# Patient Record
Sex: Female | Born: 1960 | Race: White | Hispanic: No | Marital: Married | Smoking: Current every day smoker
Health system: Southern US, Community
[De-identification: ages and names within clinical notes are randomized; demographics above are authoritative.]

## PROBLEM LIST (undated history)

## (undated) DIAGNOSIS — R112 Nausea with vomiting, unspecified: Secondary | ICD-10-CM

## (undated) DIAGNOSIS — T148XXA Other injury of unspecified body region, initial encounter: Secondary | ICD-10-CM

## (undated) DIAGNOSIS — Z8709 Personal history of other diseases of the respiratory system: Secondary | ICD-10-CM

## (undated) DIAGNOSIS — M1990 Unspecified osteoarthritis, unspecified site: Secondary | ICD-10-CM

## (undated) DIAGNOSIS — Z9109 Other allergy status, other than to drugs and biological substances: Secondary | ICD-10-CM

## (undated) DIAGNOSIS — Z9889 Other specified postprocedural states: Secondary | ICD-10-CM

## (undated) HISTORY — PX: FOOT SURGERY: SHX648

## (undated) HISTORY — PX: BACK SURGERY: SHX140

## (undated) HISTORY — PX: NECK SURGERY: SHX720

## (undated) HISTORY — PX: OTHER SURGICAL HISTORY: SHX169

## (undated) HISTORY — PX: ABDOMINAL HYSTERECTOMY: SHX81

---

## 2008-08-06 ENCOUNTER — Emergency Department (HOSPITAL_BASED_OUTPATIENT_CLINIC_OR_DEPARTMENT_OTHER): Admission: EM | Admit: 2008-08-06 | Discharge: 2008-08-06 | Payer: Self-pay | Admitting: Emergency Medicine
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
## 2015-03-28 ENCOUNTER — Emergency Department (HOSPITAL_BASED_OUTPATIENT_CLINIC_OR_DEPARTMENT_OTHER)
Admission: EM | Admit: 2015-03-28 | Discharge: 2015-03-28 | Disposition: A | Discharge status: Home / Self Care | Payer: Medicare Other | Attending: Emergency Medicine | Admitting: Emergency Medicine

## 2015-03-28 ENCOUNTER — Encounter (HOSPITAL_BASED_OUTPATIENT_CLINIC_OR_DEPARTMENT_OTHER): Payer: Self-pay

## 2015-03-28 DIAGNOSIS — R2243 Localized swelling, mass and lump, lower limb, bilateral: Secondary | ICD-10-CM | POA: Diagnosis present

## 2015-03-28 DIAGNOSIS — M7989 Other specified soft tissue disorders: Secondary | ICD-10-CM

## 2015-03-28 DIAGNOSIS — Z79899 Other long term (current) drug therapy: Secondary | ICD-10-CM | POA: Diagnosis not present

## 2015-03-28 DIAGNOSIS — R2233 Localized swelling, mass and lump, upper limb, bilateral: Secondary | ICD-10-CM | POA: Diagnosis not present

## 2015-03-28 DIAGNOSIS — M1990 Unspecified osteoarthritis, unspecified site: Secondary | ICD-10-CM | POA: Insufficient documentation

## 2015-03-28 DIAGNOSIS — Z720 Tobacco use: Secondary | ICD-10-CM | POA: Insufficient documentation

## 2015-03-28 DIAGNOSIS — G8929 Other chronic pain: Secondary | ICD-10-CM | POA: Insufficient documentation

## 2015-03-28 HISTORY — DX: Other injury of unspecified body region, initial encounter: T14.8XXA

## 2015-03-28 HISTORY — DX: Unspecified osteoarthritis, unspecified site: M19.90

## 2015-03-28 LAB — CBC WITH DIFFERENTIAL/PLATELET
BASOS ABS: 0 10*3/uL (ref 0.0–0.1)
BASOS PCT: 1 % (ref 0–1)
EOS ABS: 0 10*3/uL (ref 0.0–0.7)
EOS PCT: 1 % (ref 0–5)
HEMATOCRIT: 42.3 % (ref 36.0–46.0)
Hemoglobin: 14.3 g/dL (ref 12.0–15.0)
Lymphocytes Relative: 17 % (ref 12–46)
Lymphs Abs: 1.3 10*3/uL (ref 0.7–4.0)
MCH: 28.3 pg (ref 26.0–34.0)
MCHC: 33.8 g/dL (ref 30.0–36.0)
MCV: 83.8 fL (ref 78.0–100.0)
MONO ABS: 0.2 10*3/uL (ref 0.1–1.0)
Monocytes Relative: 3 % (ref 3–12)
Neutro Abs: 6 10*3/uL (ref 1.7–7.7)
Neutrophils Relative %: 78 % — ABNORMAL HIGH (ref 43–77)
PLATELETS: 302 10*3/uL (ref 150–400)
RBC: 5.05 MIL/uL (ref 3.87–5.11)
RDW: 13.7 % (ref 11.5–15.5)
WBC: 7.6 10*3/uL (ref 4.0–10.5)

## 2015-03-28 LAB — BRAIN NATRIURETIC PEPTIDE: B NATRIURETIC PEPTIDE 5: 40.4 pg/mL (ref 0.0–100.0)

## 2015-03-28 LAB — BASIC METABOLIC PANEL
Anion gap: 8 (ref 5–15)
BUN: 10 mg/dL (ref 6–23)
CALCIUM: 9.4 mg/dL (ref 8.4–10.5)
CO2: 26 mmol/L (ref 19–32)
CREATININE: 0.56 mg/dL (ref 0.50–1.10)
Chloride: 107 mmol/L (ref 96–112)
Glucose, Bld: 134 mg/dL — ABNORMAL HIGH (ref 70–99)
POTASSIUM: 3.7 mmol/L (ref 3.5–5.1)
Sodium: 141 mmol/L (ref 135–145)

## 2015-03-28 MED ORDER — FUROSEMIDE 20 MG PO TABS: 20.0000 mg | ORAL_TABLET | Freq: Every day | ORAL | Status: AC

## 2015-03-28 MED ORDER — HYDROCODONE-ACETAMINOPHEN 5-325 MG PO TABS
2.0000 | ORAL_TABLET | Freq: Once | ORAL | Status: DC
  Filled 2015-03-28: qty 2

## 2015-03-28 MED ORDER — CYCLOBENZAPRINE HCL 10 MG PO TABS
5.0000 mg | ORAL_TABLET | Freq: Once | ORAL | Status: AC
  Administered 2015-03-28: 5 mg via ORAL
  Filled 2015-03-28: qty 1

## 2015-03-28 NOTE — Discharge Instructions (Signed)
Return to the emergency room with worsening of symptoms, new symptoms or with symptoms that are concerning, especially , especially chest pain that feels like a pressure, spreads to left arm or jaw, worse with exertion, associated with nausea, vomiting, shortness of breath and/or sweating.  Start taking lasix. Please call your doctor for a followup appointment within 24-48 hours. When you talk to your doctor please let them know that you were seen in the emergency department and have them acquire all of your records so that they can discuss the findings with you and formulate a treatment plan to fully care for your new and ongoing problems. Read below information and follow recommendations.  Edema Edema is an abnormal buildup of fluids in your bodytissues. Edema is somewhatdependent on gravity to pull the fluid to the lowest place in your body. That makes the condition more common in the legs and thighs (lower extremities). Painless swelling of the feet and ankles is common and becomes more likely as you get older. It is also common in looser tissues, like around your eyes.  When the affected area is squeezed, the fluid may move out of that spot and leave a dent for a few moments. This dent is called pitting.  CAUSES  There are many possible causes of edema. Eating too much salt and being on your feet or sitting for a long time can cause edema in your legs and ankles. Hot weather may make edema worse. Common medical causes of edema include:  Heart failure.  Liver disease.  Kidney disease.  Weak blood vessels in your legs.  Cancer.  An injury.  Pregnancy.  Some medications.  Obesity. SYMPTOMS  Edema is usually painless.Your skin may look swollen or shiny.  DIAGNOSIS  Your health care provider may be able to diagnose edema by asking about your medical history and doing a physical exam. You may need to have tests such as X-rays, an electrocardiogram, or blood tests to check for  medical conditions that may cause edema.  TREATMENT  Edema treatment depends on the cause. If you have heart, liver, or kidney disease, you need the treatment appropriate for these conditions. General treatment may include:  Elevation of the affected body part above the level of your heart.  Compression of the affected body part. Pressure from elastic bandages or support stockings squeezes the tissues and forces fluid back into the blood vessels. This keeps fluid from entering the tissues.  Restriction of fluid and salt intake.  Use of a water pill (diuretic). These medications are appropriate only for some types of edema. They pull fluid out of your body and make you urinate more often. This gets rid of fluid and reduces swelling, but diuretics can have side effects. Only use diuretics as directed by your health care provider. HOME CARE INSTRUCTIONS   Keep the affected body part above the level of your heart when you are lying down.   Do not sit still or stand for prolonged periods.   Do not put anything directly under your knees when lying down.  Do not wear constricting clothing or garters on your upper legs.   Exercise your legs to work the fluid back into your blood vessels. This may help the swelling go down.   Wear elastic bandages or support stockings to reduce ankle swelling as directed by your health care provider.   Eat a low-salt diet to reduce fluid if your health care provider recommends it.   Only take medicines as directed  by your health care provider. SEEK MEDICAL CARE IF:   Your edema is not responding to treatment.  You have heart, liver, or kidney disease and notice symptoms of edema.  You have edema in your legs that does not improve after elevating them.   You have sudden and unexplained weight gain. SEEK IMMEDIATE MEDICAL CARE IF:   You develop shortness of breath or chest pain.   You cannot breathe when you lie down.  You develop pain,  redness, or warmth in the swollen areas.   You have heart, liver, or kidney disease and suddenly get edema.  You have a fever and your symptoms suddenly get worse. MAKE SURE YOU:   Understand these instructions.  Will watch your condition.  Will get help right away if you are not doing well or get worse. Document Released: 12/01/2005 Document Revised: 04/17/2014 Document Reviewed: 09/23/2013 [facility] Patient Information 2015 [place], [place]. This information is not intended to replace advice given to you by your health care provider. Make sure you discuss any questions you have with your health care provider.

## 2015-03-28 NOTE — ED Notes (Signed)
C/o swelling to both legs and feet x 3 days c/o pain to posterior bilat legs, feet, hips-no recent injury but fell on both knees 2 months ago on wet floor

## 2015-03-28 NOTE — ED Provider Notes (Signed)
CSN: [id number]     Arrival date & time 03/28/15  1418 History   First MD Initiated Contact with Patient 03/28/15 1437     Chief Complaint  Patient presents with  . Leg Swelling     (Consider location/radiation/quality/duration/timing/severity/associated sxs/prior Treatment) HPI  [name] [name] is a 54 y.o. female with PMH of chronic pain, nerve damage, DDD lumbar spine presenting with 3-4 weeks of bilateral hands and feet swelling. Swelling in legs better with lasix. Swelling worse with NSAIDs. Pt states she can take ibuprofen. Pt denies history of recent injury. Fell on bilateral knees 2 months ago. No numbness, tingling or weakness. No chest pain shortness of breath. No new foods or medications. No throat swelling, difficulty breathing. Denies new medication or alcohol use. Pt seen 3 days ago by family medicine at [facility] and pt received another referral to pain management. No further narcotics from her primary. Pt reports swelling with ibuprofen but continues to take due to her pain.    Past Medical History  Diagnosis Date  . Nerve damage   . Arthritis    Past Surgical History  Procedure Laterality Date  . Neck surgery    . Back surgery    . Abdominal hysterectomy    . Foot surgery    . Arm surgery     No family history on file. History  Substance Use Topics  . Smoking status: Current Every Day Smoker  . Smokeless tobacco: Not on file  . Alcohol Use: No   OB History    No data available     Review of Systems 10 Systems reviewed and are negative for acute change except as noted in the HPI.    Allergies  Nsaids  Home Medications   Prior to Admission medications   Medication Sig Start Date End Date Taking? Authorizing Provider  Gabapentin (NEURONTIN PO) Take by mouth.   Yes Historical Provider, MD  ibuprofen (ADVIL,MOTRIN) 800 MG tablet Take 800 mg by mouth every 8 (eight) hours as needed.   Yes Historical Provider, MD  furosemide (LASIX) 20 MG tablet Take 1 tablet  (20 mg total) by mouth daily. 03/28/15   Oswaldo Conroy, PA-C   BP 151/65 mmHg  Pulse 73  Temp(Src) 98 F (36.7 C) (Oral)  Resp 18  Ht  (1.651 m)  Wt 250 lb (113.399 kg)  BMI 41.60 kg/m2  SpO2 99% Physical Exam  Constitutional: She appears well-developed and well-nourished. No distress.  HENT:  Head: Normocephalic and atraumatic.  Eyes: Conjunctivae and EOM are normal. Right eye exhibits no discharge. Left eye exhibits no discharge.  Neck: Normal range of motion. Neck supple.  No midline neck tenderness  Cardiovascular: Normal rate and regular rhythm.   2+ radial pulses equal bilaterally. 2+ pedal pulses equal bilaterally  Pulmonary/Chest: Effort normal and breath sounds normal. No respiratory distress. She has no wheezes.  Abdominal: Soft. Bowel sounds are normal. She exhibits no distension. There is no tenderness.  Musculoskeletal:  1+ bilateral pitting edema to lower extremities. No other skin changes. No calf pain. Bilateral swelling to hands without skin changes. FROM  Neurological: She is alert. She exhibits normal muscle tone. Coordination normal.  No midline back tenderness, step off or crepitus. Equal muscle tone. 5/5 strength in lower extremities. DTR equal and intact. Negative straight leg test. Antalgic gait with cane.  Skin: Skin is warm and dry. She is not diaphoretic.  Nursing note and vitals reviewed.   ED Course  Procedures (including critical care time)  Labs Review Labs Reviewed  CBC WITH DIFFERENTIAL/PLATELET - Abnormal; Notable for the following:    Neutrophils Relative % 78 (*)    All other components within normal limits  BASIC METABOLIC PANEL - Abnormal; Notable for the following:    Glucose, Bld 134 (*)    All other components within normal limits  BRAIN NATRIURETIC PEPTIDE    Imaging Review No results found.   EKG Interpretation None      MDM   Final diagnoses:  Localized swelling of both lower legs  Bilateral hand swelling    Patient reports swelling to bilateral legs feet and hands for 3-4 weeks. Patient is a chronic pain patients and has been off narcotics for one year. She denies any alcohol use. Patient reports chronic back pain and takes ibuprofen. She reports ibuprofen makes her swell. VSS. No narcotic prescription given in the ED or medication in the ED. Pt states she has had problems with narcotics in the past and I do not want to introduce this problem for patient. Patient verbalizes understanding and is agreeable to this. Screening lab work without evidence of congestive heart failure, kidney disease. Unclear the etiology of patient's swelling and could be related to ibuprofen. I doubt is related to her heart or her kidneys. Patient to follow-up with her primary care provider or the office for further workup. Patient is afebrile, nontoxic, and in no acute distress. Patient is appropriate for outpatient management and is stable for discharge.  Discussed return precautions with patient. Discussed all results and patient verbalizes understanding and agrees with plan.  This is a shared patient. This patient was discussed with the physician who saw and evaluated the patient and agrees with the plan.   Oswaldo ConroyVictoria Trenesha Alcaide, PA-C 03/28/15 1924  Toy CookeyMegan Docherty, MD 03/29/15 [phone]

## 2017-06-08 ENCOUNTER — Ambulatory Visit (INDEPENDENT_AMBULATORY_CARE_PROVIDER_SITE_OTHER): Payer: Medicare Other | Admitting: Orthopaedic Surgery

## 2017-06-08 ENCOUNTER — Other Ambulatory Visit (INDEPENDENT_AMBULATORY_CARE_PROVIDER_SITE_OTHER): Payer: Self-pay

## 2017-06-08 ENCOUNTER — Ambulatory Visit (INDEPENDENT_AMBULATORY_CARE_PROVIDER_SITE_OTHER): Payer: Medicare Other

## 2017-06-08 DIAGNOSIS — M25552 Pain in left hip: Secondary | ICD-10-CM

## 2017-06-08 DIAGNOSIS — M1612 Unilateral primary osteoarthritis, left hip: Secondary | ICD-10-CM

## 2017-06-08 NOTE — Progress Notes (Signed)
Office Visit Note   Patient: [name] [name]           Date of Birth: 05/01/1961           MRN: [id number] Visit Date: 06/08/2017              Requested by: No referring provider defined for this encounter. PCP: Patient, No Pcp Per   Assessment & Plan: Visit Diagnoses:  1. Pain in left hip   2. Unilateral primary osteoarthritis, left hip     Plan: I do agree that hip replacement would be helpful for her. This could decrease her pain, improve mobility, and improved quality of life. However I do feel that trying an intra-articular steroid injection would be useful in helping determine what is the worst component of her pain. I do feel that replacement surgery could help her back. The concern definitely is her chronic narcotic use and how this still may affect her pain with recover from any type of surgery. I did answer all of her questions and gave her handout on hip replacement surgery. We went over x-rays in detail as well. At this point I will set her up for an intra-articular steroid injection in her left hip and she is agreeable to this. Risk and benefits of these injections as well. I like see her back in 3 weeks to get a better understanding of she is doing what she has injection and to see if it would heal worthwhile that point setting her up for a hip replacement.  Follow-Up Instructions: Return in about 3 weeks (around 06/29/2017).   Orders:  Orders Placed This Encounter  Procedures  . XR HIP UNILAT W OR W/O PELVIS 1V LEFT   No orders of the defined types were placed in this encounter.     Procedures: No procedures performed   Clinical Data: No additional findings.   Subjective: No chief complaint on file. The patient is a 56 year old female who comes to me for second opinion about her left hip. Says her left hip is hurt her for many years now. Hurts mainly in the groin. She can't sit for long period time due to pain. She's had at least 2 back surgeries in the past and is  been on steroids orally. Should the pain is nagging constant pain that she does ambulate using a cane as well. She is on oxycodone 15 mg 4 times a day and this does take the edge off of things. Again her pain is mainly in the groin and the way she walks says she feels it affects her back. She was told by another surgeon that she needed a hip replacement but that other surgeon Sheliah HatchWarner off all narcotics completely before he would consider hip replacement surgery. She otherwise denies any other acute medical issues. She is frustrated because her left hip pain is detrimentally affecting her activity is daily living, her mobility, and her quality of life.  HPI  Review of Systems She currently denies any headache, chest pain, shortness of breath, fever, chills, nausea, vomiting.  Objective: Vital Signs: There were no vitals taken for this visit.  Physical Exam She is alert or 3 and in no acute distress but is obviously uncomfortable when she ambulates and has a problem sitting down and getting up while examine her. Ortho Exam Her right hip is entirely normal exam with fluid internal and external rotation as well as flexion extension with no pain. Her left hip has severe pain with any attempts  of internal and external rotation but less so with flexion. Specialty Comments:  No specialty comments available.  Imaging: Xr Hip Unilat W Or W/o Pelvis 1v Left  Result Date: 06/08/2017 An AP pelvis and a lateral of her left hip show significant arthritic findings a left hip. There is joint space narrowing, para-articular osteophytes and sclerotic changes on both the femoral head and acetabular side. Her right hip appears normal.    PMFS History: There are no active problems to display for this patient.  Past Medical History:  Diagnosis Date  . Arthritis   . Nerve damage     No family history on file.  Past Surgical History:  Procedure Laterality Date  . ABDOMINAL HYSTERECTOMY    . arm surgery      . BACK SURGERY    . FOOT SURGERY    . NECK SURGERY     Social History   Occupational History  . Not on file.   Social History Main Topics  . Smoking status: Current Every Day Smoker  . Smokeless tobacco: Not on file  . Alcohol use No  . Drug use: Unknown  . Sexual activity: Not on file

## 2017-06-15 ENCOUNTER — Telehealth (INDEPENDENT_AMBULATORY_CARE_PROVIDER_SITE_OTHER): Payer: Self-pay

## 2017-06-15 NOTE — Telephone Encounter (Signed)
Patient would like to know if [facility], due to Dr. Alvester MorinNewton being out of the office this week.  CB# is [phone].

## 2017-06-16 NOTE — Telephone Encounter (Signed)
There is an order in there for injection, can we send to [facility] instead of [facility]?

## 2017-06-18 ENCOUNTER — Other Ambulatory Visit (INDEPENDENT_AMBULATORY_CARE_PROVIDER_SITE_OTHER): Payer: Self-pay | Admitting: Orthopaedic Surgery

## 2017-06-18 DIAGNOSIS — M25552 Pain in left hip: Secondary | ICD-10-CM

## 2017-06-18 NOTE — Telephone Encounter (Signed)
[facility] and she has order and will try to get pt in asap.

## 2017-06-23 ENCOUNTER — Telehealth (INDEPENDENT_AMBULATORY_CARE_PROVIDER_SITE_OTHER): Payer: Self-pay | Admitting: Orthopaedic Surgery

## 2017-06-23 NOTE — Telephone Encounter (Signed)
Patient called advised she is not doing well at all. Patient said her hip is locking up more and it is hard to walk. Patient asked if she can come in to see Dr Magnus IvanBlackman sooner.  Patient asked if she can come in Wednesday afternoon or anytime Thursday. Patient said she does not think the  epidural will work. [place] Patient sister Chip Boer(Vicki) can also be contacted per patient. The number to contact Chip BoerVicki [phone]. The number to contact patient is [phone] cell

## 2017-06-23 NOTE — Telephone Encounter (Signed)
There is really no need to see her.  It wasn't an epidural, but an intra-articular hip injection.  The only other thing we can do is have her set up for a hip replacement.  If that is what she wants, forward that to Sherrie to set it up.  I have already seen her about this.

## 2017-06-23 NOTE — Telephone Encounter (Signed)
See below

## 2017-06-23 NOTE — Telephone Encounter (Signed)
Patient does want to schedule that surgery

## 2017-06-24 ENCOUNTER — Ambulatory Visit (INDEPENDENT_AMBULATORY_CARE_PROVIDER_SITE_OTHER): Payer: Medicare Other | Admitting: Orthopaedic Surgery

## 2017-06-29 ENCOUNTER — Inpatient Hospital Stay: Admission: RE | Admit: 2017-06-29 | Payer: Medicare Other | Source: Ambulatory Visit

## 2017-07-01 ENCOUNTER — Ambulatory Visit (INDEPENDENT_AMBULATORY_CARE_PROVIDER_SITE_OTHER): Payer: Medicare Other | Admitting: Orthopaedic Surgery

## 2017-07-02 ENCOUNTER — Telehealth (INDEPENDENT_AMBULATORY_CARE_PROVIDER_SITE_OTHER): Payer: Self-pay | Admitting: Orthopaedic Surgery

## 2017-07-02 NOTE — Telephone Encounter (Signed)
Patient called to schedule surgery. You can contact her at [phone]

## 2017-07-02 NOTE — Telephone Encounter (Signed)
I called [phone] with no answer and voice mail was full.  I called [phone], listed as home number, and left voice mail for patient to return call.  Chip BoerVicki is not listed on DPR.

## 2017-07-03 ENCOUNTER — Telehealth (INDEPENDENT_AMBULATORY_CARE_PROVIDER_SITE_OTHER): Payer: Self-pay

## 2017-07-03 NOTE — Telephone Encounter (Signed)
Spoke with patient and scheduled surgery. °

## 2017-07-03 NOTE — Telephone Encounter (Signed)
Spoke with patient and scheduled surgery for 07/24/17.  She takes Oxycodone 15 mg but is requesting something else for pain.  Please call her sister Teressa SenterVicki Warren   [phone].  Patient gave permission to speak to her.

## 2017-07-06 NOTE — Telephone Encounter (Signed)
Please advise 

## 2017-07-06 NOTE — Telephone Encounter (Signed)
Patient aware of the below message  

## 2017-07-06 NOTE — Telephone Encounter (Signed)
There is nothing else I can give her.

## 2017-07-08 ENCOUNTER — Ambulatory Visit (INDEPENDENT_AMBULATORY_CARE_PROVIDER_SITE_OTHER): Payer: Medicare Other | Admitting: Orthopaedic Surgery

## 2017-07-13 NOTE — Progress Notes (Signed)
Please place orders in EPIC as patient is being scheduled for a pre-op appointment! Thank you! 

## 2017-07-14 ENCOUNTER — Other Ambulatory Visit (INDEPENDENT_AMBULATORY_CARE_PROVIDER_SITE_OTHER): Payer: Self-pay | Admitting: Physician Assistant

## 2017-07-17 ENCOUNTER — Other Ambulatory Visit (INDEPENDENT_AMBULATORY_CARE_PROVIDER_SITE_OTHER): Payer: Self-pay | Admitting: Orthopaedic Surgery

## 2017-07-20 ENCOUNTER — Other Ambulatory Visit (HOSPITAL_COMMUNITY): Payer: Self-pay | Admitting: Emergency Medicine

## 2017-07-20 ENCOUNTER — Encounter (INDEPENDENT_AMBULATORY_CARE_PROVIDER_SITE_OTHER): Payer: Self-pay

## 2017-07-20 ENCOUNTER — Encounter (HOSPITAL_COMMUNITY): Payer: Self-pay

## 2017-07-20 ENCOUNTER — Encounter (HOSPITAL_COMMUNITY)
Admission: RE | Admit: 2017-07-20 | Discharge: 2017-07-20 | Disposition: A | Discharge status: Home / Self Care | Payer: Medicare Other | Source: Ambulatory Visit | Attending: Orthopaedic Surgery | Admitting: Orthopaedic Surgery

## 2017-07-20 DIAGNOSIS — M1612 Unilateral primary osteoarthritis, left hip: Secondary | ICD-10-CM | POA: Diagnosis not present

## 2017-07-20 DIAGNOSIS — Z01818 Encounter for other preprocedural examination: Secondary | ICD-10-CM | POA: Diagnosis present

## 2017-07-20 HISTORY — DX: Other allergy status, other than to drugs and biological substances: Z91.09

## 2017-07-20 HISTORY — DX: Other specified postprocedural states: Z98.890

## 2017-07-20 HISTORY — DX: Other specified postprocedural states: R11.2

## 2017-07-20 LAB — CBC
HCT: 39.8 % (ref 36.0–46.0)
HEMOGLOBIN: 13.4 g/dL (ref 12.0–15.0)
MCH: 28 pg (ref 26.0–34.0)
MCHC: 33.7 g/dL (ref 30.0–36.0)
MCV: 83.1 fL (ref 78.0–100.0)
Platelets: 323 10*3/uL (ref 150–400)
RBC: 4.79 MIL/uL (ref 3.87–5.11)
RDW: 13.6 % (ref 11.5–15.5)
WBC: 9.7 10*3/uL (ref 4.0–10.5)

## 2017-07-20 LAB — BASIC METABOLIC PANEL
Anion gap: 10 (ref 5–15)
BUN: 18 mg/dL (ref 6–20)
CHLORIDE: 107 mmol/L (ref 101–111)
CO2: 21 mmol/L — ABNORMAL LOW (ref 22–32)
Calcium: 9 mg/dL (ref 8.9–10.3)
Creatinine, Ser: 0.95 mg/dL (ref 0.44–1.00)
GFR calc Af Amer: >60 mL/min (ref >60)
GFR calc non Af Amer: >60 mL/min (ref >60)
Glucose, Bld: 122 mg/dL — ABNORMAL HIGH (ref 65–99)
POTASSIUM: 4.2 mmol/L (ref 3.5–5.1)
SODIUM: 138 mmol/L (ref 135–145)

## 2017-07-20 LAB — SURGICAL PCR SCREEN
MRSA, PCR: NEGATIVE
STAPHYLOCOCCUS AUREUS: NEGATIVE

## 2017-07-20 NOTE — Patient Instructions (Signed)
[name] [name]  07/20/2017   Your procedure is scheduled on: 07-24-17  Report [facility]  [facility]tr[facility]   Report to admitting at 7:20AM   Call this number if you have problems the morning of surgery [phone]   Remember: ONLY 1 PERSON MAY GO WITH YOU TO SHORT STAY TO GET  READY MORNING OF YOUR SURGERY.  Do not eat food or drink liquids :After Midnight.     Take these medicines the morning of surgery with A SIP OF WATER: oxycodone as needed, omeprazole(prilosec) as needed, inhaler as needed (may bring to hospital)                                  You may not have any metal on your body including hair pins and              piercings  Do not wear jewelry, make-up, lotions, powders or perfumes, deodorant             Do not wear nail polish.  Do not shave  48 hours prior to surgery.                 Do not bring valuables to the hospital. [place] IS NOT             RESPONSIBLE   FOR VALUABLES.  Contacts, dentures or bridgework may not be worn into surgery.  Leave suitcase in the car. After surgery it may be brought to your room.                Please read over the following fact sheets you were given: _____________________________________________________________________   [facility] - Preparing for Surgery Before surgery, you can play an important role.  Because skin is not sterile, your skin needs to be as free of germs as possible.  You can reduce the number of germs on your skin by washing with CHG (chlorahexidine gluconate) soap before surgery.  CHG is an antiseptic cleaner which kills germs and bonds with the skin to continue killing germs even after washing. Please DO NOT use if you have an allergy to CHG or antibacterial soaps.  If your skin becomes reddened/irritated stop using the CHG and inform your nurse when you arrive at Short Stay. Do not shave (including legs and underarms) for at least 48 hours prior to the first CHG shower.  You may  shave your face/neck. Please follow these instructions carefully:  1.  Shower with CHG Soap the night before surgery and the  morning of Surgery.  2.  If you choose to wash your hair, wash your hair first as usual with your  normal  shampoo.  3.  After you shampoo, rinse your hair and body thoroughly to remove the  shampoo.                           4.  Use CHG as you would any other liquid soap.  You can apply chg directly  to the skin and wash                       Gently with a scrungie or clean washcloth.  5.  Apply the CHG Soap to your body ONLY FROM THE NECK DOWN.  Do not use on face/ open                           Wound or open sores. Avoid contact with eyes, ears mouth and genitals (private parts).                       Wash face,  Genitals (private parts) with your normal soap.             6.  Wash thoroughly, paying special attention to the area where your surgery  will be performed.  7.  Thoroughly rinse your body with warm water from the neck down.  8.  DO NOT shower/wash with your normal soap after using and rinsing off  the CHG Soap.                9.  Pat yourself dry with a clean towel.            10.  Wear clean pajamas.            11.  Place clean sheets on your bed the night of your first shower and do not  sleep with pets. Day of Surgery : Do not apply any lotions/deodorants the morning of surgery.  Please wear clean clothes to the hospital/surgery center.  FAILURE TO FOLLOW THESE INSTRUCTIONS MAY RESULT IN THE CANCELLATION OF YOUR SURGERY PATIENT SIGNATURE_________________________________  NURSE SIGNATURE__________________________________  ________________________________________________________________________   [name] [name]  An incentive spirometer is a tool that can help keep your lungs clear and active. This tool measures how well you are filling your lungs with each breath. Taking long deep breaths may help reverse or decrease the chance of developing  breathing (pulmonary) problems (especially infection) following:  A long period of time when you are unable to move or be active. BEFORE THE PROCEDURE   If the spirometer includes an indicator to show your best effort, your nurse or respiratory therapist will set it to a desired goal.  If possible, sit up straight or lean slightly forward. Try not to slouch.  Hold the incentive spirometer in an upright position. INSTRUCTIONS FOR USE  1. Sit on the edge of your bed if possible, or sit up as far as you can in bed or on a chair. 2. Hold the incentive spirometer in an upright position. 3. Breathe out normally. 4. Place the mouthpiece in your mouth and seal your lips tightly around it. 5. Breathe in slowly and as deeply as possible, raising the piston or the ball toward the top of the column. 6. Hold your breath for 3-5 seconds or for as long as possible. Allow the piston or ball to fall to the bottom of the column. 7. Remove the mouthpiece from your mouth and breathe out normally. 8. Rest for a few seconds and repeat Steps 1 through 7 at least 10 times every 1-2 hours when you are awake. Take your time and take a few normal breaths between deep breaths. 9. The spirometer may include an indicator to show your best effort. Use the indicator as a goal to work toward during each repetition. 10. After each set of 10 deep breaths, practice coughing to be sure your lungs are clear. If you have an incision (the cut made at the time of surgery), support your incision when coughing by placing a pillow or rolled up towels firmly against it. Once you are able to get out of  bed, walk around indoors and cough well. You may stop using the incentive spirometer when instructed by your caregiver.  RISKS AND COMPLICATIONS  Take your time so you do not get dizzy or light-headed.  If you are in pain, you may need to take or ask for pain medication before doing incentive spirometry. It is harder to take a deep  breath if you are having pain. AFTER USE  Rest and breathe slowly and easily.  It can be helpful to keep track of a log of your progress. Your caregiver can provide you with a simple table to help with this. If you are using the spirometer at home, follow these instructions: [place] IF:   You are having difficultly using the spirometer.  You have trouble using the spirometer as often as instructed.  Your pain medication is not giving enough relief while using the spirometer.  You develop fever of 100.5 F (38.1 C) or higher. SEEK IMMEDIATE MEDICAL CARE IF:   You cough up bloody sputum that had not been present before.  You develop fever of 102 F (38.9 C) or greater.  You develop worsening pain at or near the incision site. MAKE SURE YOU:   Understand these instructions.  Will watch your condition.  Will get help right away if you are not doing well or get worse. Document Released: 04/13/2007 Document Revised: 02/23/2012 Document Reviewed: 06/14/2007 [facility] Patient Information 2014 [place], [place].   ________________________________________________________________________

## 2017-07-23 MED ORDER — CEFAZOLIN SODIUM 10 G IJ SOLR
3.0000 g | INTRAMUSCULAR | Status: AC
  Administered 2017-07-24: 3 g via INTRAVENOUS
  Filled 2017-07-23: qty 3

## 2017-07-24 ENCOUNTER — Inpatient Hospital Stay (HOSPITAL_COMMUNITY): Payer: Medicare Other

## 2017-07-24 ENCOUNTER — Encounter (HOSPITAL_COMMUNITY): Payer: Self-pay | Admitting: *Deleted

## 2017-07-24 ENCOUNTER — Inpatient Hospital Stay (HOSPITAL_COMMUNITY): Payer: Medicare Other | Admitting: Anesthesiology

## 2017-07-24 ENCOUNTER — Inpatient Hospital Stay (HOSPITAL_COMMUNITY)
Admission: RE | Admit: 2017-07-24 | Discharge: 2017-07-27 | DRG: 470 | Disposition: A | Discharge status: Home Health | Payer: Medicare Other | Source: Ambulatory Visit | Attending: Orthopaedic Surgery | Admitting: Orthopaedic Surgery

## 2017-07-24 ENCOUNTER — Encounter (HOSPITAL_COMMUNITY)
Admission: RE | Disposition: A | Discharge status: Home Health | Payer: Self-pay | Source: Ambulatory Visit | Attending: Orthopaedic Surgery

## 2017-07-24 DIAGNOSIS — Z888 Allergy status to other drugs, medicaments and biological substances status: Secondary | ICD-10-CM | POA: Diagnosis not present

## 2017-07-24 DIAGNOSIS — Z79891 Long term (current) use of opiate analgesic: Secondary | ICD-10-CM | POA: Diagnosis not present

## 2017-07-24 DIAGNOSIS — M1612 Unilateral primary osteoarthritis, left hip: Principal | ICD-10-CM

## 2017-07-24 DIAGNOSIS — Z96642 Presence of left artificial hip joint: Secondary | ICD-10-CM

## 2017-07-24 DIAGNOSIS — Z79899 Other long term (current) drug therapy: Secondary | ICD-10-CM

## 2017-07-24 DIAGNOSIS — Z419 Encounter for procedure for purposes other than remedying health state, unspecified: Secondary | ICD-10-CM

## 2017-07-24 DIAGNOSIS — F17210 Nicotine dependence, cigarettes, uncomplicated: Secondary | ICD-10-CM | POA: Diagnosis present

## 2017-07-24 HISTORY — DX: Personal history of other diseases of the respiratory system: Z87.09

## 2017-07-24 HISTORY — PX: TOTAL HIP ARTHROPLASTY: SHX124

## 2017-07-24 SURGERY — ARTHROPLASTY, HIP, TOTAL, ANTERIOR APPROACH
Anesthesia: Spinal | Site: Hip | Laterality: Left

## 2017-07-24 MED ORDER — MIDAZOLAM HCL 5 MG/5ML IJ SOLN
INTRAMUSCULAR | Status: DC | PRN
  Administered 2017-07-24: 2 mg via INTRAVENOUS

## 2017-07-24 MED ORDER — ONDANSETRON HCL 4 MG/2ML IJ SOLN
INTRAMUSCULAR | Status: AC
  Filled 2017-07-24: qty 2

## 2017-07-24 MED ORDER — PROPOFOL 10 MG/ML IV BOLUS
INTRAVENOUS | Status: AC
  Filled 2017-07-24: qty 20

## 2017-07-24 MED ORDER — HYDROMORPHONE HCL 2 MG PO TABS
2.0000 mg | ORAL_TABLET | ORAL | Status: DC | PRN
  Administered 2017-07-24: 15:00:00 2 mg via ORAL
  Filled 2017-07-24: qty 1

## 2017-07-24 MED ORDER — DOCUSATE SODIUM 100 MG PO CAPS
100.0000 mg | ORAL_CAPSULE | Freq: Two times a day (BID) | ORAL | Status: DC
  Administered 2017-07-24 – 2017-07-27 (×6): 100 mg via ORAL
  Filled 2017-07-24 (×6): qty 1

## 2017-07-24 MED ORDER — ACETAMINOPHEN 325 MG PO TABS
650.0000 mg | ORAL_TABLET | Freq: Four times a day (QID) | ORAL | Status: DC | PRN
  Administered 2017-07-25 – 2017-07-27 (×3): 650 mg via ORAL
  Filled 2017-07-24 (×3): qty 2

## 2017-07-24 MED ORDER — ONDANSETRON HCL 4 MG/2ML IJ SOLN: 4.0000 mg | Freq: Four times a day (QID) | INTRAMUSCULAR | Status: DC | PRN

## 2017-07-24 MED ORDER — FENTANYL CITRATE (PF) 100 MCG/2ML IJ SOLN
INTRAMUSCULAR | Status: AC
  Filled 2017-07-24: qty 2

## 2017-07-24 MED ORDER — MENTHOL 3 MG MT LOZG: 1.0000 | LOZENGE | OROMUCOSAL | Status: DC | PRN

## 2017-07-24 MED ORDER — BISACODYL 10 MG RE SUPP: 10.0000 mg | Freq: Every day | RECTAL | Status: DC | PRN

## 2017-07-24 MED ORDER — ONDANSETRON HCL 4 MG/2ML IJ SOLN
INTRAMUSCULAR | Status: DC | PRN
  Administered 2017-07-24: 4 mg via INTRAVENOUS

## 2017-07-24 MED ORDER — FENTANYL CITRATE (PF) 100 MCG/2ML IJ SOLN
INTRAMUSCULAR | Status: DC | PRN
  Administered 2017-07-24: 100 ug via INTRAVENOUS

## 2017-07-24 MED ORDER — METOCLOPRAMIDE HCL 5 MG/ML IJ SOLN: 5.0000 mg | Freq: Three times a day (TID) | INTRAMUSCULAR | Status: DC | PRN

## 2017-07-24 MED ORDER — CEFAZOLIN SODIUM-DEXTROSE 1-4 GM/50ML-% IV SOLN
1.0000 g | Freq: Four times a day (QID) | INTRAVENOUS | Status: AC
  Administered 2017-07-24 (×2): 1 g via INTRAVENOUS
  Filled 2017-07-24 (×2): qty 50

## 2017-07-24 MED ORDER — PHENYLEPHRINE 40 MCG/ML (10ML) SYRINGE FOR IV PUSH (FOR BLOOD PRESSURE SUPPORT)
PREFILLED_SYRINGE | INTRAVENOUS | Status: DC | PRN
  Administered 2017-07-24: 80 ug via INTRAVENOUS

## 2017-07-24 MED ORDER — SODIUM CHLORIDE 0.9 % IR SOLN
Status: DC | PRN
  Administered 2017-07-24: 1000 mL

## 2017-07-24 MED ORDER — ALUM & MAG HYDROXIDE-SIMETH 200-200-20 MG/5ML PO SUSP: 30.0000 mL | ORAL | Status: DC | PRN

## 2017-07-24 MED ORDER — ACETAMINOPHEN 650 MG RE SUPP: 650.0000 mg | Freq: Four times a day (QID) | RECTAL | Status: DC | PRN

## 2017-07-24 MED ORDER — HYDROMORPHONE HCL-NACL 0.5-0.9 MG/ML-% IV SOSY
1.0000 mg | PREFILLED_SYRINGE | INTRAVENOUS | Status: DC | PRN
  Administered 2017-07-24: 13:00:00 1 mg via INTRAVENOUS
  Administered 2017-07-24: 15:00:00 2 mg via INTRAVENOUS
  Administered 2017-07-25 (×3): 1 mg via INTRAVENOUS
  Administered 2017-07-26 (×2): 2 mg via INTRAVENOUS
  Filled 2017-07-24: qty 4
  Filled 2017-07-24 (×4): qty 2
  Filled 2017-07-24 (×2): qty 4
  Filled 2017-07-24: qty 2

## 2017-07-24 MED ORDER — SODIUM CHLORIDE 0.9 % IV SOLN
INTRAVENOUS | Status: DC
  Administered 2017-07-24 – 2017-07-25 (×2): via INTRAVENOUS

## 2017-07-24 MED ORDER — OXYCODONE HCL ER 20 MG PO T12A
20.0000 mg | EXTENDED_RELEASE_TABLET | Freq: Two times a day (BID) | ORAL | Status: DC
  Administered 2017-07-24 – 2017-07-27 (×6): 20 mg via ORAL
  Filled 2017-07-24 (×6): qty 1

## 2017-07-24 MED ORDER — CARISOPRODOL 350 MG PO TABS
350.0000 mg | ORAL_TABLET | Freq: Two times a day (BID) | ORAL | Status: DC
  Administered 2017-07-24 – 2017-07-27 (×6): 350 mg via ORAL
  Filled 2017-07-24 (×6): qty 1

## 2017-07-24 MED ORDER — PROPOFOL 500 MG/50ML IV EMUL
INTRAVENOUS | Status: DC | PRN
  Administered 2017-07-24: 100 ug/kg/min via INTRAVENOUS

## 2017-07-24 MED ORDER — OXYCODONE HCL 5 MG PO TABS
5.0000 mg | ORAL_TABLET | ORAL | Status: DC | PRN
  Administered 2017-07-24: 13:00:00 10 mg via ORAL
  Filled 2017-07-24: qty 2

## 2017-07-24 MED ORDER — DEXAMETHASONE SODIUM PHOSPHATE 10 MG/ML IJ SOLN
INTRAMUSCULAR | Status: AC
  Filled 2017-07-24: qty 1

## 2017-07-24 MED ORDER — LIDOCAINE 2% (20 MG/ML) 5 ML SYRINGE
INTRAMUSCULAR | Status: AC
  Filled 2017-07-24: qty 5

## 2017-07-24 MED ORDER — CHLORHEXIDINE GLUCONATE 4 % EX LIQD: 60.0000 mL | Freq: Once | CUTANEOUS | Status: DC

## 2017-07-24 MED ORDER — PHENOL 1.4 % MT LIQD: 1.0000 | OROMUCOSAL | Status: DC | PRN

## 2017-07-24 MED ORDER — OXYCODONE HCL 5 MG PO TABS
10.0000 mg | ORAL_TABLET | ORAL | Status: DC | PRN
  Administered 2017-07-24 – 2017-07-27 (×19): 20 mg via ORAL
  Filled 2017-07-24 (×19): qty 4

## 2017-07-24 MED ORDER — PROPOFOL 10 MG/ML IV BOLUS
INTRAVENOUS | Status: DC | PRN
  Administered 2017-07-24 (×3): 20 mg via INTRAVENOUS

## 2017-07-24 MED ORDER — ALBUTEROL SULFATE (2.5 MG/3ML) 0.083% IN NEBU: 2.5000 mg | INHALATION_SOLUTION | Freq: Four times a day (QID) | RESPIRATORY_TRACT | Status: DC | PRN

## 2017-07-24 MED ORDER — DIPHENHYDRAMINE HCL 12.5 MG/5ML PO ELIX: 12.5000 mg | ORAL_SOLUTION | ORAL | Status: DC | PRN

## 2017-07-24 MED ORDER — POLYETHYLENE GLYCOL 3350 17 G PO PACK: 17.0000 g | PACK | Freq: Every day | ORAL | Status: DC | PRN

## 2017-07-24 MED ORDER — METOCLOPRAMIDE HCL 5 MG PO TABS: 5.0000 mg | ORAL_TABLET | Freq: Three times a day (TID) | ORAL | Status: DC | PRN

## 2017-07-24 MED ORDER — MIDAZOLAM HCL 2 MG/2ML IJ SOLN
INTRAMUSCULAR | Status: AC
  Filled 2017-07-24: qty 2

## 2017-07-24 MED ORDER — SUCCINYLCHOLINE CHLORIDE 200 MG/10ML IV SOSY
PREFILLED_SYRINGE | INTRAVENOUS | Status: AC
  Filled 2017-07-24: qty 10

## 2017-07-24 MED ORDER — LACTATED RINGERS IV SOLN
INTRAVENOUS | Status: DC
  Administered 2017-07-24 (×2): via INTRAVENOUS

## 2017-07-24 MED ORDER — KETOROLAC TROMETHAMINE 15 MG/ML IJ SOLN
7.5000 mg | Freq: Four times a day (QID) | INTRAMUSCULAR | Status: DC
  Administered 2017-07-25 – 2017-07-26 (×4): 7.5 mg via INTRAVENOUS
  Filled 2017-07-24 (×4): qty 1

## 2017-07-24 MED ORDER — FENTANYL CITRATE (PF) 100 MCG/2ML IJ SOLN: 25.0000 ug | INTRAMUSCULAR | Status: DC | PRN

## 2017-07-24 MED ORDER — LIDOCAINE 2% (20 MG/ML) 5 ML SYRINGE
INTRAMUSCULAR | Status: DC | PRN
  Administered 2017-07-24: 100 mg via INTRAVENOUS

## 2017-07-24 MED ORDER — TRANEXAMIC ACID 1000 MG/10ML IV SOLN
1000.0000 mg | INTRAVENOUS | Status: AC
  Administered 2017-07-24: 1000 mg via INTRAVENOUS
  Filled 2017-07-24: qty 1100

## 2017-07-24 MED ORDER — BUPIVACAINE IN DEXTROSE 0.75-8.25 % IT SOLN
INTRATHECAL | Status: DC | PRN
  Administered 2017-07-24: 1.8 mL via INTRATHECAL

## 2017-07-24 MED ORDER — PROMETHAZINE HCL 25 MG/ML IJ SOLN: 6.2500 mg | INTRAMUSCULAR | Status: DC | PRN

## 2017-07-24 MED ORDER — PHENYLEPHRINE 40 MCG/ML (10ML) SYRINGE FOR IV PUSH (FOR BLOOD PRESSURE SUPPORT)
PREFILLED_SYRINGE | INTRAVENOUS | Status: AC
  Filled 2017-07-24: qty 10

## 2017-07-24 MED ORDER — RIVAROXABAN 10 MG PO TABS
10.0000 mg | ORAL_TABLET | Freq: Every day | ORAL | Status: DC
  Administered 2017-07-25 – 2017-07-27 (×3): 10 mg via ORAL
  Filled 2017-07-24 (×3): qty 1

## 2017-07-24 MED ORDER — CYCLOBENZAPRINE HCL 10 MG PO TABS
10.0000 mg | ORAL_TABLET | Freq: Three times a day (TID) | ORAL | Status: DC | PRN
  Filled 2017-07-24: qty 1

## 2017-07-24 MED ORDER — ONDANSETRON HCL 4 MG PO TABS: 4.0000 mg | ORAL_TABLET | Freq: Four times a day (QID) | ORAL | Status: DC | PRN

## 2017-07-24 SURGICAL SUPPLY — 37 items
BAG ZIPLOCK 12X15 (MISCELLANEOUS) IMPLANT
BENZOIN TINCTURE PRP APPL 2/3 (GAUZE/BANDAGES/DRESSINGS) IMPLANT
BLADE SAW SGTL 18X1.27X75 (BLADE) ×2 IMPLANT
BLADE SAW SGTL 18X1.27X75MM (BLADE) ×1
CAPT HIP TOTAL 2 ×3 IMPLANT
CELLS DAT CNTRL 66122 CELL SVR (MISCELLANEOUS) ×1 IMPLANT
CLOSURE WOUND 1/2 X4 (GAUZE/BANDAGES/DRESSINGS)
COVER PERINEAL POST (MISCELLANEOUS) ×3 IMPLANT
COVER SURGICAL LIGHT HANDLE (MISCELLANEOUS) ×3 IMPLANT
DRAPE STERI IOBAN 125X83 (DRAPES) ×3 IMPLANT
DRAPE U-SHAPE 47X51 STRL (DRAPES) ×6 IMPLANT
DRSG AQUACEL AG ADV 3.5X10 (GAUZE/BANDAGES/DRESSINGS) ×3 IMPLANT
DURAPREP 26ML APPLICATOR (WOUND CARE) ×3 IMPLANT
ELECT REM PT RETURN 15FT ADLT (MISCELLANEOUS) ×3 IMPLANT
GAUZE XEROFORM 1X8 LF (GAUZE/BANDAGES/DRESSINGS) ×3 IMPLANT
GLOVE BIO SURGEON STRL SZ7.5 (GLOVE) ×3 IMPLANT
GLOVE BIOGEL PI IND STRL 7.5 (GLOVE) ×4 IMPLANT
GLOVE BIOGEL PI IND STRL 8 (GLOVE) ×2 IMPLANT
GLOVE BIOGEL PI INDICATOR 7.5 (GLOVE) ×8
GLOVE BIOGEL PI INDICATOR 8 (GLOVE) ×4
GLOVE ECLIPSE 8.0 STRL XLNG CF (GLOVE) ×3 IMPLANT
GOWN STRL REUS W/TWL XL LVL3 (GOWN DISPOSABLE) ×6 IMPLANT
HANDPIECE INTERPULSE COAX TIP (DISPOSABLE) ×2
HOLDER FOLEY CATH W/STRAP (MISCELLANEOUS) ×3 IMPLANT
PACK ANTERIOR HIP CUSTOM (KITS) ×3 IMPLANT
RTRCTR WOUND ALEXIS 18CM MED (MISCELLANEOUS) ×3
SET HNDPC FAN SPRY TIP SCT (DISPOSABLE) ×1 IMPLANT
STAPLER VISISTAT 35W (STAPLE) ×3 IMPLANT
STRIP CLOSURE SKIN 1/2X4 (GAUZE/BANDAGES/DRESSINGS) IMPLANT
SUT ETHIBOND NAB CT1 #1 30IN (SUTURE) ×3 IMPLANT
SUT MNCRL AB 4-0 PS2 18 (SUTURE) IMPLANT
SUT VIC AB 0 CT1 36 (SUTURE) ×3 IMPLANT
SUT VIC AB 1 CT1 36 (SUTURE) ×3 IMPLANT
SUT VIC AB 2-0 CT1 27 (SUTURE) ×4
SUT VIC AB 2-0 CT1 TAPERPNT 27 (SUTURE) ×2 IMPLANT
TRAY FOLEY CATH 14FR (SET/KITS/TRAYS/PACK) ×3 IMPLANT
YANKAUER SUCT BULB TIP 10FT TU (MISCELLANEOUS) ×3 IMPLANT

## 2017-07-24 NOTE — Anesthesia Preprocedure Evaluation (Addendum)
Anesthesia Evaluation  Patient identified by MRN, date of birth, ID band Patient awake    Reviewed: Allergy & Precautions, NPO status , Patient's Chart, lab work & pertinent test results  History of Anesthesia Complications (+) PONV and history of anesthetic complications  Airway Mallampati: III  TM Distance: >3 FB Neck ROM: Limited    Dental no notable dental hx. (+) Dental Advisory Given, Chipped, Missing, Poor Dentition,    Pulmonary Current Smoker,    Pulmonary exam normal breath sounds clear to auscultation       Cardiovascular negative cardio ROS Normal cardiovascular exam Rhythm:Regular Rate:Normal     Neuro/Psych negative neurological ROS  negative psych ROS   GI/Hepatic negative GI ROS, Neg liver ROS,   Endo/Other  Obesity  Renal/GU negative Renal ROS  negative genitourinary   Musculoskeletal  (+) Arthritis ,   Abdominal   Peds  Hematology negative hematology ROS (+)   Anesthesia Other Findings S/p cervical and lumbar spine surgeries  Reproductive/Obstetrics negative OB ROS                           Anesthesia Physical Anesthesia Plan  ASA: II  Anesthesia Plan: Spinal   Post-op Pain Management:    Induction: Intravenous  PONV Risk Score and Plan: 2 and Ondansetron, Dexamethasone and Treatment may vary due to age or medical condition  Airway Management Planned: Nasal Cannula  Additional Equipment:   Intra-op Plan:   Post-operative Plan: Extubation in OR  Informed Consent: I have reviewed the patients History and Physical, chart, labs and discussed the procedure including the risks, benefits and alternatives for the proposed anesthesia with the patient or authorized representative who has indicated his/her understanding and acceptance.   Dental advisory given  Plan Discussed with: CRNA  Anesthesia Plan Comments: (Discussed at length with patient regarding possible  difficulties performing spinal given history of multiple lumbar surgeries. Patient wishes to move forward with spinal, but understands moderate possibility of aborting procedure due to difficulty and is accepting of general anesthesia as a backup plan.)      Anesthesia Quick Evaluation

## 2017-07-24 NOTE — Brief Op Note (Signed)
07/24/2017  11:20 AM  PATIENT:  [name] [name]  56 y.o. female  PRE-OPERATIVE DIAGNOSIS:  osteoarthritis left hip  POST-OPERATIVE DIAGNOSIS:  osteoarthritis left hip  PROCEDURE:  Procedure(s): LEFT TOTAL HIP ARTHROPLASTY ANTERIOR APPROACH (Left)  SURGEON:  Surgeon(s) and Role:    Kathryne Hitch* Latima Hamza Y, MD - Primary  PHYSICIAN ASSISTANT: Rexene EdisonGil Clark, PA-C  ANESTHESIA:   spinal  EBL:  Total I/O In: 1000 [I.V.:1000] Out: 550 [Urine:200; Blood:350]  COUNTS:  YES  DICTATION: .Other Dictation: Dictation Number [id number]  PLAN OF CARE: Admit to inpatient   PATIENT DISPOSITION:  PACU - hemodynamically stable.   Delay start of Pharmacological VTE agent (>24hrs) due to surgical blood loss or risk of bleeding: no

## 2017-07-24 NOTE — Anesthesia Procedure Notes (Signed)
Spinal  Patient location during procedure: OR Start time: 07/24/2017 9:59 AM End time: 07/24/2017 10:04 AM Reason for block: procedure for pain Staffing Anesthesiologist: Leslye PeerBROCK, THOMAS E Performed: anesthesiologist  Preanesthetic Checklist Completed: patient identified, surgical consent, pre-op evaluation, timeout performed, IV checked, risks and benefits discussed and monitors and equipment checked Spinal Block Patient position: sitting Prep: DuraPrep Patient monitoring: heart rate, cardiac monitor, continuous pulse ox and blood pressure Approach: midline Location: L3-4 Injection technique: single-shot Needle Needle type: Pencan  Needle gauge: 24 G Additional Notes Functioning IV was confirmed and monitors were applied. Sterile prep and drape, including hand hygiene, mask, and sterile gloves were used. The patient was positioned and the spine was prepped. The skin was anesthetized with lidocaine. Free flow of clear CSF was obtained prior to injecting local anesthetic into the CSF. The spinal needle aspirated freely following injection. The needle was carefully withdrawn. The patient tolerated the procedure well. Consent was obtained prior to the procedure with all questions answered and concerns addressed. Risks including, but not limited to, bleeding, infection, nerve damage, paralysis, failed block, inadequate analgesia, allergic reaction, high spinal, itching, and headache were discussed and the patient wished to proceed.  Leslye Peerhomas Brock, MD

## 2017-07-24 NOTE — H&P (Signed)
TOTAL HIP ADMISSION H&P  Patient is admitted for left total hip arthroplasty.  Subjective:  Chief Complaint: left hip pain  HPI: [name] [name], 56 y.o. female, has a history of pain and functional disability in the left hip(s) due to arthritis and patient has failed non-surgical conservative treatments for greater than 12 weeks to include NSAID's and/or analgesics, corticosteriod injections, flexibility and strengthening excercises, use of assistive devices, weight reduction as appropriate and activity modification.  Onset of symptoms was gradual starting 5 years ago with gradually worsening course since that time.The patient noted no past surgery on the left hip(s).  Patient currently rates pain in the left hip at 10 out of 10 with activity. Patient has night pain, worsening of pain with activity and weight bearing, trendelenberg gait, pain that interfers with activities of daily living, pain with passive range of motion and crepitus. Patient has evidence of subchondral sclerosis, periarticular osteophytes and joint space narrowing by imaging studies. This condition presents safety issues increasing the risk of falls.  There is no current active infection.  Patient Active Problem List   Diagnosis Date Noted  . Unilateral primary osteoarthritis, left hip 07/24/2017   Past Medical History:  Diagnosis Date  . Arthritis   . Environmental allergies   . History of bronchitis   . Nerve damage    hx of cervical and lumbar surgeries   . PONV (postoperative nausea and vomiting)     Past Surgical History:  Procedure Laterality Date  . ABDOMINAL HYSTERECTOMY    . arm surgery    . BACK SURGERY     times 3  . FOOT SURGERY    . NECK SURGERY      Prescriptions Prior to Admission  Medication Sig Dispense Refill Last Dose  . carisoprodol (SOMA) 350 MG tablet Take 350 mg by mouth 2 (two) times daily.   07/23/2017 at Unknown time  . cyclobenzaprine (FLEXERIL) 10 MG tablet Take 10 mg by mouth 3 (three)  times daily as needed for muscle spasms. for pain  0 Past Week at Unknown time  . ibuprofen (ADVIL,MOTRIN) 200 MG tablet Take 600 mg by mouth 2 (two) times daily.     Marland Kitchen. omeprazole (PRILOSEC) 20 MG capsule Take 20 mg by mouth daily as needed (heartburn).    Past Week at Unknown time  . oxyCODONE (ROXICODONE) 15 MG immediate release tablet Take 15 mg by mouth 4 (four) times daily. for pain  0 07/24/2017 at 0700  . Vitamin D, Ergocalciferol, (DRISDOL) 50000 units CAPS capsule Take 50,000 Units by mouth once a week. Monday  5   . albuterol (PROVENTIL HFA;VENTOLIN HFA) 108 (90 Base) MCG/ACT inhaler Inhale 2 puffs into the lungs every 6 (six) hours as needed for wheezing or shortness of breath.    More than a month at Unknown time  . furosemide (LASIX) 20 MG tablet Take 1 tablet (20 mg total) by mouth daily. (Patient taking differently: Take 20 mg by mouth daily as needed for edema (for swelling due to NSAIDs or MSG allergies). ) 30 tablet 0 More than a month at Unknown time   Allergies  Allergen Reactions  . Monosodium Glutamate Swelling  . Nsaids Swelling and Rash    Edema in eyes ,hands and feet Feet,  ankles  . Pregabalin Swelling    Ankle swelling  . Celecoxib Swelling    Social History  Substance Use Topics  . Smoking status: Current Every Day Smoker    Packs/day: 0.50    Years:  30.00    Types: Cigarettes  . Smokeless tobacco: Never Used  . Alcohol use No    History reviewed. No pertinent family history.   Review of Systems  Musculoskeletal: Positive for joint pain.  All other systems reviewed and are negative.   Objective:  Physical Exam  Constitutional: She is oriented to person, place, and time. She appears well-developed and well-nourished.  HENT:  Head: Normocephalic and atraumatic.  Eyes: Pupils are equal, round, and reactive to light. EOM are normal.  Neck: Normal range of motion. Neck supple.  Cardiovascular: Normal rate and regular rhythm.   Respiratory: Effort  normal and breath sounds normal.  GI: Soft.  Musculoskeletal:       Left hip: She exhibits decreased range of motion, decreased strength, tenderness and bony tenderness.  Neurological: She is alert and oriented to person, place, and time.  Skin: Skin is warm and dry.  Psychiatric: She has a normal mood and affect.    Vital signs in last 24 hours: Temp:  [98.4 F (36.9 C)] 98.4 F (36.9 C) (08/10 0805) Pulse Rate:  [80] 80 (08/10 0804) Resp:  [20] 20 (08/10 0804) BP: (137)/(71) 137/71 (08/10 0804) SpO2:  [99 %] 99 % (08/10 0804) Weight:  [223 lb (101.2 kg)] 223 lb (101.2 kg) (08/10 0821)  Labs:   Estimated body mass index is 34.93 kg/m as calculated from the following:   Height as of this encounter: 5\' 7"  (1.702 m).   Weight as of this encounter: 223 lb (101.2 kg).   Imaging Review Plain radiographs demonstrate severe degenerative joint disease of the left hip(s). The bone quality appears to be good for age and reported activity level.  Assessment/Plan:  End stage arthritis, left hip(s)  The patient history, physical examination, clinical judgement of the provider and imaging studies are consistent with end stage degenerative joint disease of the left hip(s) and total hip arthroplasty is deemed medically necessary. The treatment options including medical management, injection therapy, arthroscopy and arthroplasty were discussed at length. The risks and benefits of total hip arthroplasty were presented and reviewed. The risks due to aseptic loosening, infection, stiffness, dislocation/subluxation,  thromboembolic complications and other imponderables were discussed.  The patient acknowledged the explanation, agreed to proceed with the plan and consent was signed. Patient is being admitted for inpatient treatment for surgery, pain control, PT, OT, prophylactic antibiotics, VTE prophylaxis, progressive ambulation and ADL's and discharge planning.The patient is planning to be discharged  home with home health services

## 2017-07-24 NOTE — Anesthesia Procedure Notes (Signed)
Date/Time: 07/24/2017 9:53 AM Performed by: Leroy LibmanEARDON, Agueda Houpt L Oxygen Delivery Method: Simple face mask

## 2017-07-24 NOTE — Transfer of Care (Signed)
Immediate Anesthesia Transfer of Care Note  Patient: [name] [name]  Procedure(s) Performed: Procedure(s): LEFT TOTAL HIP ARTHROPLASTY ANTERIOR APPROACH (Left)  Patient Location: PACU  Anesthesia Type:Spinal  Level of Consciousness: awake, alert  and patient cooperative  Airway & Oxygen Therapy: Patient Spontanous Breathing and Patient connected to face mask oxygen  Post-op Assessment: Report given to RN and Post -op Vital signs reviewed and stable  Post vital signs: Reviewed and stable  Last Vitals:  Vitals:   07/24/17 0805 07/24/17 1138  BP:  117/63  Pulse:  77  Resp:  11  Temp: 36.9 C 36.6 C  SpO2:  96%    Last Pain:  Vitals:   07/24/17 0805  TempSrc: Oral  PainSc:       Patients Stated Pain Goal: 1 (07/24/17 0804)  Complications: No apparent anesthesia complications

## 2017-07-24 NOTE — Op Note (Signed)
NAMRubin Payor:  [name], [name]                 ACCOUNT NO.:  [id number]  MEDICAL RECORD NO.:  [id number]  LOCATION:  [facility]                         [facility]  PHYSICIAN:  Vanita PandaChristopher Y. Magnus IvanBlackman, M.D.DATE OF BIRTH:  [birth year]  DATE OF PROCEDURE:  07/24/2017 DATE OF DISCHARGE:                              OPERATIVE REPORT   PREOPERATIVE DIAGNOSIS:  Primary osteoarthritis and degenerative joint disease, left hip.  POSTOPERATIVE DIAGNOSIS:  Primary osteoarthritis and degenerative joint disease, left hip.  PROCEDURE:  Left total hip arthroplasty through direct anterior approach.  IMPLANTS:  DePuy Sector Gription acetabular component size 50, size 32 +0 polyethylene liner, size 13 Corail femoral component with standard offset, size 32 +1 ceramic hip ball.  SURGEON:  Vanita PandaChristopher Y. Magnus IvanBlackman, M.D.  ASSISTANT:  Richardean CanalGilbert Clark, PA-C.  ANESTHESIA:  Spinal.  ANTIBIOTICS:  3 g of IV Ancef.  BLOOD LOSS:  350 mL.  COMPLICATIONS:  None.  INDICATIONS:  [name] is a 56 year old female, well known to me.  She has debilitating arthritis involving her left hip.  This has been well documented with x-rays and clinical exam.  Her pain is daily and it is detrimentally affected her activities of daily living, her quality of life and her mobility.  At this point, she does wish to proceed with a total hip arthroplasty.  She understands fully the risks of acute blood loss anemia, nerve and vessel injury, fracture, infection, dislocation, and DVT.  She understands our goals are to decrease pain, improve mobility and overall improved quality of life.  PROCEDURE DESCRIPTION:  After informed consent was obtained, appropriate left hip was marked.  She was brought to the operating room where spinal anesthesia was obtained while she was on her stretcher.  She was laid in a supine position on the stretcher.  A Foley catheter was placed and both feet had traction boots applied to them.  Next, she was  placed supine on the Hana fracture table with the perineal post in place and both legs in inline skeletal traction devices, but no traction applied. Her left operative hip was prepped and draped with DuraPrep and sterile drapes.  Time-out was called, she was identified as correct patient and correct left hip.  We then made an incision just inferior and posterior to the anterior superior iliac spine and carried this obliquely down the leg.  We dissected down the tensor fascia lata muscle.  The tensor fascia was then divided longitudinally to proceed with a direct anterior approach to the hip.  We identified and cauterized the circumflex vessels, then identified the hip capsule.  We opened up the hip capsule in an L-type format, finding a very large joint effusion and significant periarticular osteophytes.  I placed Cobra retractors around the medial and lateral femoral neck and then made our femoral neck cut with an oscillating saw proximal to the lesser trochanter and completed this with an osteotome.  I placed a corkscrew guide in the femoral head and removed the femoral head in its entirety and found it to have significant areas devoid of cartilage.  We then cleaned the acetabulum, remnants of the acetabular labrum and other debris.  I placed a bent Linus OrnHohmann  over the medial acetabular rim and a Cobra retractor laterally. I then removed remnants of the acetabular labrum.  We then began reaming under direct visualization from a size 42 reamer going in stepwise increments up to size 50 with all reamers under direct visualization and the last reamer under direct fluoroscopy, so I could obtain my depth of reaming, our inclination and anteversion.  Once I was pleased with this, I placed the real DePuy Sector Gription acetabular component size 50 and a 32 +0 polyethylene liner for that size acetabular component. Attention was then turned to the femur.  With the leg externally rotated to 120  degrees extended and adducted, we were able to place a Mueller retractor medially and a Hohmann retractor behind the greater trochanter.  We released the lateral joint capsule and used a box cutting osteotome to enter the femoral canal and a rongeur to lateralize and then began broaching from a size 8 broach using the Corail broaching system going up to a size 13 with all broaches under direct visualization and the last under direct fluoroscopy as well.  Once I placed the size 13 down, we trialed a standard offset femoral neck and a 32 +1 hip ball, we reduced this in the acetabulum and we were pleased with stability, leg length and offset.  We then dislocated the hip and removed the trial components.  We were able to place the real Corail femoral component size 13 with standard offset, the real 32 +1 ceramic hip ball and reduced this in the acetabulum.  We then irrigated the soft tissues with normal saline solution using pulsatile lavage.  We closed the joint capsule with interrupted #1 Ethibond suture followed by running #1 Vicryl in the tensor fascia, 0 Vicryl in the deep tissue, 2-0 Vicryl in the subcutaneous tissue, 4-0 Monocryl subcuticular stitch and Steri-Strips on the skin.  An Aquacel dressing was applied.  She was taken off the Hana table and taken to the recovery room in stable condition.  All final counts were correct.  There were no complications noted.  Of note, Richardean Canal, PA-C, assisted in the entire case.  His assistance was crucial facilitating all aspects of this case.     Vanita Panda. Magnus Ivan, M.D.     CYB/MEDQ  D:  07/24/2017  T:  07/24/2017  Job:  [id number]

## 2017-07-24 NOTE — Anesthesia Postprocedure Evaluation (Signed)
Anesthesia Post Note  Patient: [name] [name]  Procedure(s) Performed: Procedure(s) (LRB): LEFT TOTAL HIP ARTHROPLASTY ANTERIOR APPROACH (Left)     Patient location during evaluation: PACU Anesthesia Type: Spinal Level of consciousness: oriented and awake and alert Pain management: pain level controlled Vital Signs Assessment: post-procedure vital signs reviewed and stable Respiratory status: spontaneous breathing, respiratory function stable and patient connected to nasal cannula oxygen Cardiovascular status: blood pressure returned to baseline and stable Postop Assessment: no headache and no backache Anesthetic complications: no    Last Vitals:  Vitals:   07/24/17 1215 07/24/17 1235  BP: 137/79 (!) 141/90  Pulse: 70 76  Resp: 17 16  Temp: (!) 36.4 C (!) 36.4 C  SpO2: 100% 100%    Last Pain:  Vitals:   07/24/17 1235  TempSrc:   PainSc: 4                  Beryle Lathehomas E Estalene Bergey

## 2017-07-24 NOTE — Discharge Instructions (Addendum)
Information on my medicine - XARELTO® (Rivaroxaban) ° ° °Why was Xarelto® prescribed for you? °Xarelto® was prescribed for you to reduce the risk of blood clots forming after orthopedic surgery. The medical term for these abnormal blood clots is venous thromboembolism (VTE). ° °What do you need to know about xarelto® ? °Take your Xarelto® ONCE DAILY at the same time every day. °You may take it either with or without food. ° °If you have difficulty swallowing the tablet whole, you may crush it and mix in applesauce just prior to taking your dose. ° °Take Xarelto® exactly as prescribed by your doctor and DO NOT stop taking Xarelto® without talking to the doctor who prescribed the medication.  Stopping without other VTE prevention medication to take the place of Xarelto® may increase your risk of developing a clot. ° °After discharge, you should have regular check-up appointments with your healthcare provider that is prescribing your Xarelto®.   ° °What do you do if you miss a dose? °If you miss a dose, take it as soon as you remember on the same day then continue your regularly scheduled once daily regimen the next day. Do not take two doses of Xarelto® on the same day.  ° °Important Safety Information °A possible side effect of Xarelto® is bleeding. You should call your healthcare provider right away if you experience any of the following: °? Bleeding from an injury or your nose that does not stop. °? Unusual colored urine (red or dark brown) or unusual colored stools (red or black). °? Unusual bruising for unknown reasons. °? A serious fall or if you hit your head (even if there is no bleeding). ° °Some medicines may interact with Xarelto® and might increase your risk of bleeding while on Xarelto®. To help avoid this, consult your healthcare provider or pharmacist prior to using any new prescription or non-prescription medications, including herbals, vitamins, non-steroidal anti-inflammatory drugs (NSAIDs) and  supplements. ° °This website has more information on Xarelto®: www.xarelto.com. ° °INSTRUCTIONS AFTER JOINT REPLACEMENT  ° °o Remove items at home which could result in a fall. This includes throw rugs or furniture in walking pathways °o ICE to the affected joint every three hours while awake for 30 minutes at a time, for at least the first 3-5 days, and then as needed for pain and swelling.  Continue to use ice for pain and swelling. You may notice swelling that will progress down to the foot and ankle.  This is normal after surgery.  Elevate your leg when you are not up walking on it.   °o Continue to use the breathing machine you got in the hospital (incentive spirometer) which will help keep your temperature down.  It is common for your temperature to cycle up and down following surgery, especially at night when you are not up moving around and exerting yourself.  The breathing machine keeps your lungs expanded and your temperature down. ° ° °DIET:  As you were doing prior to hospitalization, we recommend a well-balanced diet. ° °DRESSING / WOUND CARE / SHOWERING ° °Keep the surgical dressing until follow up.  The dressing is water proof, so you can shower without any extra covering.  IF THE DRESSING FALLS OFF or the wound gets wet inside, change the dressing with sterile gauze.  Please use good hand washing techniques before changing the dressing.  Do not use any lotions or creams on the incision until instructed by your surgeon.   ° °ACTIVITY ° °o Increase activity   slowly as tolerated, but follow the weight bearing instructions below.   °o No driving for 6 weeks or until further direction given by your physician.  You cannot drive while taking narcotics.  °o No lifting or carrying greater than 10 lbs. until further directed by your surgeon. °o Avoid periods of inactivity such as sitting longer than an hour when not asleep. This helps prevent blood clots.  °o You may return to work once you are authorized by  your doctor.  ° ° ° °WEIGHT BEARING  ° °Weight bearing as tolerated with assist device (walker, cane, etc) as directed, use it as long as suggested by your surgeon or therapist, typically at least 4-6 weeks. ° ° °EXERCISES ° °Results after joint replacement surgery are often greatly improved when you follow the exercise, range of motion and muscle strengthening exercises prescribed by your doctor. Safety measures are also important to protect the joint from further injury. Any time any of these exercises cause you to have increased pain or swelling, decrease what you are doing until you are comfortable again and then slowly increase them. If you have problems or questions, call your caregiver or physical therapist for advice.  ° °Rehabilitation is important following a joint replacement. After just a few days of immobilization, the muscles of the leg can become weakened and shrink (atrophy).  These exercises are designed to build up the tone and strength of the thigh and leg muscles and to improve motion. Often times heat used for twenty to thirty minutes before working out will loosen up your tissues and help with improving the range of motion but do not use heat for the first two weeks following surgery (sometimes heat can increase post-operative swelling).  ° °These exercises can be done on a training (exercise) mat, on the floor, on a table or on a bed. Use whatever works the best and is most comfortable for you.    Use music or television while you are exercising so that the exercises are a pleasant break in your day. This will make your life better with the exercises acting as a break in your routine that you can look forward to.   Perform all exercises about fifteen times, three times per day or as directed.  You should exercise both the operative leg and the other leg as well. ° °Exercises include: °  °• Quad Sets - Tighten up the muscle on the front of the thigh (Quad) and hold for 5-10 seconds.    °• Straight Leg Raises - With your knee straight (if you were given a brace, keep it on), lift the leg to 60 degrees, hold for 3 seconds, and slowly lower the leg.  Perform this exercise against resistance later as your leg gets stronger.  °• Leg Slides: Lying on your back, slowly slide your foot toward your buttocks, bending your knee up off the floor (only go as far as is comfortable). Then slowly slide your foot back down until your leg is flat on the floor again.  °• Angel Wings: Lying on your back spread your legs to the side as far apart as you can without causing discomfort.  °• Hamstring Strength:  Lying on your back, push your heel against the floor with your leg straight by tightening up the muscles of your buttocks.  Repeat, but this time bend your knee to a comfortable angle, and push your heel against the floor.  You may put a pillow under the heel to make   it more comfortable if necessary.  ° °A rehabilitation program following joint replacement surgery can speed recovery and prevent re-injury in the future due to weakened muscles. Contact your doctor or a physical therapist for more information on knee rehabilitation.  ° ° °CONSTIPATION ° °Constipation is defined medically as fewer than three stools per week and severe constipation as less than one stool per week.  Even if you have a regular bowel pattern at home, your normal regimen is likely to be disrupted due to multiple reasons following surgery.  Combination of anesthesia, postoperative narcotics, change in appetite and fluid intake all can affect your bowels.  ° °YOU MUST use at least one of the following options; they are listed in order of increasing strength to get the job done.  They are all available over the counter, and you may need to use some, POSSIBLY even all of these options:   ° °Drink plenty of fluids (prune juice may be helpful) and high fiber foods °Colace 100 mg by mouth twice a day  °Senokot for constipation as directed and as  needed Dulcolax (bisacodyl), take with full glass of water  °Miralax (polyethylene glycol) once or twice a day as needed. ° °If you have tried all these things and are unable to have a bowel movement in the first 3-4 days after surgery call either your surgeon or your primary doctor.   ° °If you experience loose stools or diarrhea, hold the medications until you stool forms back up.  If your symptoms do not get better within 1 week or if they get worse, check with your doctor.  If you experience "the worst abdominal pain ever" or develop nausea or vomiting, please contact the office immediately for further recommendations for treatment. ° ° °ITCHING:  If you experience itching with your medications, try taking only a single pain pill, or even half a pain pill at a time.  You can also use Benadryl over the counter for itching or also to help with sleep.  ° °TED HOSE STOCKINGS:  Use stockings on both legs until for at least 2 weeks or as directed by physician office. They may be removed at night for sleeping. ° °MEDICATIONS:  See your medication summary on the “After Visit Summary” that nursing will review with you.  You may have some home medications which will be placed on hold until you complete the course of blood thinner medication.  It is important for you to complete the blood thinner medication as prescribed. ° °PRECAUTIONS:  If you experience chest pain or shortness of breath - call 911 immediately for transfer to the hospital emergency department.  ° °If you develop a fever greater that 101 F, purulent drainage from wound, increased redness or drainage from wound, foul odor from the wound/dressing, or calf pain - CONTACT YOUR SURGEON.   °                                                °FOLLOW-UP APPOINTMENTS:  If you do not already have a post-op appointment, please call the office for an appointment to be seen by your surgeon.  Guidelines for how soon to be seen are listed in your “After Visit Summary”, but  are typically between 1-4 weeks after surgery. ° °OTHER INSTRUCTIONS:  ° °Knee Replacement:  Do not place pillow under knee, focus   on keeping the knee straight while resting. CPM instructions: 0-90 degrees, 2 hours in the morning, 2 hours in the afternoon, and 2 hours in the evening. Place foam block, curve side up under heel at all times except when in CPM or when walking.  DO NOT modify, tear, cut, or change the foam block in any way. ° °MAKE SURE YOU:  °• Understand these instructions.  °• Get help right away if you are not doing well or get worse.  ° ° °Thank you for letting us be a part of your medical care team.  It is a privilege we respect greatly.  We hope these instructions will help you stay on track for a fast and full recovery!  ° °

## 2017-07-25 LAB — CBC
HCT: 33.9 % — ABNORMAL LOW (ref 36.0–46.0)
Hemoglobin: 11.2 g/dL — ABNORMAL LOW (ref 12.0–15.0)
MCH: 27.1 pg (ref 26.0–34.0)
MCHC: 33 g/dL (ref 30.0–36.0)
MCV: 82.1 fL (ref 78.0–100.0)
Platelets: 237 10*3/uL (ref 150–400)
RBC: 4.13 MIL/uL (ref 3.87–5.11)
RDW: 13.6 % (ref 11.5–15.5)
WBC: 8.5 10*3/uL (ref 4.0–10.5)

## 2017-07-25 LAB — BASIC METABOLIC PANEL
Anion gap: 8 (ref 5–15)
BUN: 9 mg/dL (ref 6–20)
CALCIUM: 8.9 mg/dL (ref 8.9–10.3)
CO2: 25 mmol/L (ref 22–32)
CREATININE: 0.58 mg/dL (ref 0.44–1.00)
Chloride: 106 mmol/L (ref 101–111)
GFR calc non Af Amer: >60 mL/min (ref >60)
Glucose, Bld: 117 mg/dL — ABNORMAL HIGH (ref 65–99)
Potassium: 3.8 mmol/L (ref 3.5–5.1)
SODIUM: 139 mmol/L (ref 135–145)

## 2017-07-25 NOTE — Progress Notes (Signed)
PT Cancellation Note  Patient Details Name: [name] PeterLisa Hao MRN: [id number] DOB: 11/08/1961   Cancelled Treatment:    Reason Eval/Treat Not Completed: Pain limiting ability to participate Pt crying in pain upon entering room.  Pt unable to tolerate second session due to pain.  Pt has been receiving pain medication, RN notified.   Andren Bethea,KATHrine E 07/25/2017, 2:58 PM Zenovia JarredKati Sequoia Mincey, PT, DPT 07/25/2017 Pager: [phone]

## 2017-07-25 NOTE — Progress Notes (Signed)
Subjective: 1 Day Post-Op Procedure(s) (LRB): LEFT TOTAL HIP ARTHROPLASTY ANTERIOR APPROACH (Left) Patient reports pain as moderate.    Objective: Vital signs in last 24 hours: Temp:  [97.5 F (36.4 C)-98.3 F (36.8 C)] 98.3 F (36.8 C) (08/11 0611) Pulse Rate:  [69-97] 97 (08/11 0611) Resp:  [11-18] 17 (08/11 0611) BP: (100-160)/(63-95) 134/76 (08/11 0611) SpO2:  [96 %-100 %] 98 % (08/11 0611)  Intake/Output from previous day: 08/10 0701 - 08/11 0700 In: 4288.4 [P.O.:1400; I.V.:2678.4; IV Piggyback:210] Out: 6075 [Urine:5725; Blood:350] Intake/Output this shift: No intake/output data recorded.   Recent Labs  07/25/17 0531  HGB 11.2*    Recent Labs  07/25/17 0531  WBC 8.5  RBC 4.13  HCT 33.9*  PLT 237    Recent Labs  07/25/17 0531  NA 139  K 3.8  CL 106  CO2 25  BUN 9  CREATININE 0.58  GLUCOSE 117*  CALCIUM 8.9   No results for input(s): LABPT, INR in the last 72 hours.  Sensation intact distally Intact pulses distally Dorsiflexion/Plantar flexion intact Incision: dressing C/D/I  Assessment/Plan: 1 Day Post-Op Procedure(s) (LRB): LEFT TOTAL HIP ARTHROPLASTY ANTERIOR APPROACH (Left) Up with therapy Discharge home with home health Monday.  [name] [name] 07/25/2017, 9:15 AM

## 2017-07-25 NOTE — Progress Notes (Signed)
OT EVALUATION    History of Present Illness Pt is a 56 year old female s/p L direct anterior THA with hx of cervical and lumbar spine surgeriesPt is a 56 y.o. Female S/P left total hip arthroplasty anterior approach. PMH of pain and functional disability in the left hip due to osteoarthritis. PMH: back surgery x3, foot surgery.    Clinical Impression   Pt reports being independent with all ADLs PTA. Currently patient requires moderate assist for LB ADLs and min assist functional mobility at RW level. Pt reports d/c home with intermittent help from family. OT recommends use of tub shower transfer bench when bathing initially and possibly progress to shower chair when pain decreases. OT will follow acutely to address established goals.       07/25/17 0905  OT Visit Information  Last OT Received On 07/25/17  Assistance Needed +1  Restrictions  Weight Bearing Restrictions No  Home Living  Family/patient expects to be discharged to: Private residence  Living Arrangements Spouse/significant other;Children  Available Help at Discharge Family  Type of Home House  Home Access Stairs to enter  Entrance Stairs-Number of Steps 3  Entrance Stairs-Rails None  Home Layout One level  Bathroom Nurse, children'shower/Tub Tub/shower unit  Bathroom Toilet Standard  Bathroom Accessibility Yes  How Accessible Other (comment) (Cane)  Home Equipment Walker - 2 wheels;Cane - single point;Shower seat;Tub bench;Grab bars - toilet;Grab bars - tub/shower  Additional Comments Pt reports that husband and child can help with functional mobility around the home.   Prior Function  Level of Independence Independent with assistive device(s)  Comments Use of can/walker as needed.   Communication  Communication No difficulties  Pain Assessment  Pain Assessment 0-10  Pain Score 0 (Pain increased with functional mobility. )  Pain Location hip  Pain Descriptors / Indicators Aching;Sore;Shooting (Shooting pain when walking. Aching  and sore after mobility.)  Pain Intervention(s) Limited activity within patient's tolerance;Monitored during session;Ice applied  Cognition  Arousal/Alertness Awake/alert  Behavior During Therapy WFL for tasks assessed/performed  Overall Cognitive Status Within Functional Limits for tasks assessed  ADL  Overall ADL's  Needs assistance/impaired  Eating/Feeding Set up;Sitting  Eating/Feeding Details (indicate cue type and reason) Pt needs assistance with functional mobility to obtain food from kitchen.  Grooming Set up;Sitting;Min guard  Upper Body Bathing Set up;Supervision/ safety;Sitting  Upper Body Bathing Details (indicate cue type and reason)   Lower Body Bathing Sit to/from stand;Moderate assistance  Lower Body Bathing Details (indicate cue type and reason) Pt requires moderate assistance to reach LB for bathing. Tub shower bench for safety.   Upper Body Dressing  Supervision/safety;Set up;Sitting  Lower Body Dressing Sit to/from stand;Moderate assistance  Lower Body Dressing Details (indicate cue type and reason) Pt educated on initial need for moderate assistance to don/doff clothing over feet and knees.   Toilet Transfer Minimal assistance;Ambulation;RW  Toileting- Clothing Manipulation and Hygiene Moderate assistance  Toileting - Clothing Manipulation Details (indicate cue type and reason)   Tub/ Librarian, academichower Transfer   Tub/Shower Transfer Details (indicate cue type and reason) Pt educated on initial use of tub shower transfer bench.   Functional mobility during ADLs Minimal assistance;Rolling walker  General ADL Comments Minimal assistance needed for functional mobility when completing ADLs for safety.   Bed Mobility  Overal bed mobility Needs Assistance  Bed Mobility Supine to Sit  Supine to sit Moderate assist  General bed mobility comments Pt required min assist for LUE. Pt educated on adapted method to transfer LUE using  RUE.   Transfers  Overall transfer level Needs assistance   Equipment used Rolling walker (2 wheeled)  Transfers Sit to/from Stand  Sit to Stand Min assist  General transfer comment Min assist for functional mobility due to pain and for safety. Pt education on proper use of walker.   Balance  Overall balance assessment Needs assistance  Sitting-balance support Feet supported  Sitting balance-Leahy Scale Good  Sitting balance - Comments Pt able to sit in chair with feet supported.   Standing balance support Bilateral upper extremity supported;During functional activity  Standing balance-Leahy Scale Poor  Standing balance comment Pt requires min assist for functional mobility. Balance impaired due to pain in left hip.   OT - End of Session  Equipment Utilized During Treatment Gait belt;Rolling walker  Activity Tolerance Patient tolerated treatment well  Patient left in chair;with call bell/phone within reach;with chair alarm set  Nurse Communication Mobility status;Patient requests pain meds  OT Assessment  OT Recommendation/Assessment Patient needs continued OT Services  OT Visit Diagnosis Unsteadiness on feet (R26.81);Muscle weakness (generalized) (M62.81);Pain  Pain - Right/Left Left  Pain - part of body Hip  OT Problem List Decreased strength;Decreased range of motion;Decreased activity tolerance;Impaired balance (sitting and/or standing);Decreased safety awareness;Pain  OT Plan  OT Frequency (ACUTE ONLY) Min 2X/week  OT Treatment/Interventions (ACUTE ONLY) Self-care/ADL training;DME and/or AE instruction;Therapeutic activities;Patient/family education  AM-PAC OT "6 Clicks" Daily Activity Outcome Measure  Help from another person eating meals? 4  Help from another person taking care of personal grooming? 3  Help from another person toileting, which includes using toliet, bedpan, or urinal? 2  Help from another person bathing (including washing, rinsing, drying)? 2  Help from another person to put on and taking off regular upper body  clothing? 3  Help from another person to put on and taking off regular lower body clothing? 2  6 Click Score 16  ADL G Code Conversion CK  Acute Rehab OT Goals  Patient Stated Goal Decrease pain and go home  OT Goal Formulation With patient  Time For Goal Achievement 08/08/17  Potential to Achieve Goals Good  OT Time Calculation  OT Start Time (ACUTE ONLY) 1610  OT Stop Time (ACUTE ONLY) 0855  OT Time Calculation (min) 33 min    Cammy Copa, [place] #[phone]  Reviewed and agree. [facility], OT/L  [phone] 07/25/2017

## 2017-07-25 NOTE — Progress Notes (Signed)
OT Note - Addendum    07/25/17 1400  OT Visit Information  Last OT Received On 07/25/17  OT Time Calculation  OT Start Time (ACUTE ONLY) [id number]  OT Stop Time (ACUTE ONLY) 0855  OT Time Calculation (min) 33 min  OT General Charges  $OT Visit 1 Procedure  OT Evaluation  $OT Eval Moderate Complexity 1 Procedure  OT Treatments  $Self Care/Home Management  8-22 mins  [facility] Lyn[facility], OT/L  [phone] 07/25/2017

## 2017-07-25 NOTE — Evaluation (Signed)
Physical Therapy Evaluation Patient Details Name: [name] [name] MRN: [id number] DOB: 26-Jul-1961 Today's Date: 07/25/2017   History of Present Illness  Pt is a 56 year old female s/p L direct anterior THA with hx of cervical and lumbar spine surgeries  Clinical Impression  Pt is s/p THA resulting in the deficits listed below (see PT Problem List).  Pt will benefit from skilled PT to increase their independence and safety with mobility to allow discharge to the venue listed below.  Pt mobilizing fairly well POD #1 and plans to d/c home on Monday with assist from spouse.     Follow Up Recommendations Home health PT    Equipment Recommendations  None recommended by PT    Recommendations for Other Services       Precautions / Restrictions Precautions Precautions: None Restrictions Other Position/Activity Restrictions: WBAT      Mobility  Bed Mobility               General bed mobility comments: pt up in recliner on arrival  Transfers Overall transfer level: Needs assistance Equipment used: Rolling walker (2 wheeled) Transfers: Sit to/from Stand Sit to Stand: Min guard         General transfer comment: min/guard for safety, verbal cues for hand placement and L LE forward for pain control  Ambulation/Gait Ambulation/Gait assistance: Min guard Ambulation Distance (Feet): 100 Feet Assistive device: Rolling walker (2 wheeled) Gait Pattern/deviations: Step-through pattern;Decreased stride length;Decreased stance time - left;Antalgic     General Gait Details: verbal cues for sequence and RW positioning. pt reports L LE feels longer  Stairs            Wheelchair Mobility    Modified Rankin (Stroke Patients Only)       Balance                                             Pertinent Vitals/Pain Pain Assessment: 0-10 Pain Score: 5  Pain Location: L hip Pain Descriptors / Indicators: Aching;Sore Pain Intervention(s): Premedicated before  session;Monitored during session;Limited activity within patient's tolerance;Repositioned    Home Living Family/patient expects to be discharged to:: Private residence Living Arrangements: Spouse/significant other;Children Available Help at Discharge: Family Type of Home: House Home Access: Stairs to enter Entrance Stairs-Rails: None Entrance Stairs-Number of Steps: 3 Home Layout: One level Home Equipment: Environmental consultant - 2 wheels;Cane - single point;Shower seat;Tub bench;Grab bars - toilet;Grab bars - tub/shower Additional Comments: pt reports her child is disabled, spouse to assist if needed    Prior Function Level of Independence: Independent with assistive device(s)         Comments: Use of cane/walker as needed.      Hand Dominance        Extremity/Trunk Assessment        Lower Extremity Assessment Lower Extremity Assessment: LLE deficits/detail LLE Deficits / Details: anticipated post-op hip weakness       Communication   Communication: No difficulties  Cognition Arousal/Alertness: Awake/alert Behavior During Therapy: WFL for tasks assessed/performed Overall Cognitive Status: Within Functional Limits for tasks assessed                                        General Comments      Exercises     Assessment/Plan  PT Assessment Patient needs continued PT services  PT Problem List Decreased strength;Decreased mobility;Pain       PT Treatment Interventions Functional mobility training;Stair training;Gait training;Therapeutic activities;Therapeutic exercise;DME instruction    PT Goals (Current goals can be found in the Care Plan section)  Acute Rehab PT Goals PT Goal Formulation: With patient Time For Goal Achievement: 07/29/17 Potential to Achieve Goals: Good    Frequency 7X/week   Barriers to discharge        Co-evaluation               AM-PAC PT "6 Clicks" Daily Activity  Outcome Measure Difficulty turning over in bed  (including adjusting bedclothes, sheets and blankets)?: A Lot Difficulty moving from lying on back to sitting on the side of the bed? : Total Difficulty sitting down on and standing up from a chair with arms (e.g., wheelchair, bedside commode, etc,.)?: Total Help needed moving to and from a bed to chair (including a wheelchair)?: A Little Help needed walking in hospital room?: A Little Help needed climbing 3-5 steps with a railing? : A Little 6 Click Score: 13    End of Session   Activity Tolerance: Patient tolerated treatment well Patient left: Other (comment) (in bathroom attempting to urinate, agreeable to use pull cord and wait for NT to assist back to bed) Nurse Communication:  (NT aware pt in bathroom and to request assist back to bed) PT Visit Diagnosis: Other abnormalities of gait and mobility (R26.89)    Time: 1040-1054 PT Time Calculation (min) (ACUTE ONLY): 14 min   Charges:   PT Evaluation $PT Eval Low Complexity: 1 Low     PT G CodesZenovia Jarred:        Kati Alease Fait, PT, DPT 07/25/2017 Pager: [phone]   Maida SaleLEMYRE,KATHrine E 07/25/2017, 1:20 PM

## 2017-07-26 MED ORDER — OXYCODONE HCL 15 MG PO TABS: 15.0000 mg | ORAL_TABLET | ORAL | 0 refills | Status: DC | PRN

## 2017-07-26 MED ORDER — RIVAROXABAN 10 MG PO TABS: 10.0000 mg | ORAL_TABLET | Freq: Every day | ORAL | 0 refills | Status: DC

## 2017-07-26 MED ORDER — CYCLOBENZAPRINE HCL 10 MG PO TABS: 10.0000 mg | ORAL_TABLET | Freq: Three times a day (TID) | ORAL | 0 refills | Status: AC | PRN

## 2017-07-26 NOTE — Progress Notes (Signed)
Occupational Therapy Treatment Patient Details Name: [name] [name] MRN: [id number] DOB: [birth year] Today's Date: 07/26/2017    History of present illness Pt is a 56 year old female s/p L direct anterior THA with hx of cervical and lumbar spine surgeries   OT comments  Pt tolerated session well with reported 3/10 pain this session. Practiced up to the commode with walker as well as LB dressing with AE. Discussed safety with walker use and managing ADL. Will continue to follow.   Follow Up Recommendations  Home health OT    Equipment Recommendations  None recommended by OT    Recommendations for Other Services      Precautions / Restrictions Precautions Precautions: Fall Restrictions Weight Bearing Restrictions: No Other Position/Activity Restrictions: WBAT       Mobility Bed Mobility Overal bed mobility: Needs Assistance Bed Mobility: Supine to Sit     Supine to sit: Min assist;HOB elevated     General bed mobility comments: assist for L LE over the edge of the bed.   Transfers Overall transfer level: Needs assistance Equipment used: Rolling walker (2 wheeled) Transfers: Sit to/from Stand Sit to Stand: Min assist         General transfer comment: min assist from commode with grab bar; min guard assist from EOB. Cues for hand placement.     Balance                                           ADL either performed or assessed with clinical judgement   ADL                       Lower Body Dressing: Minimal assistance;With adaptive equipment;Sit to/from stand   Toilet Transfer: Minimal assistance;Ambulation;RW;Comfort height toilet   Toileting- Clothing Manipulation and Hygiene: Minimal assistance;Sit to/from stand         General ADL Comments: Educated on AE options but pt states is familiar from her husband using AE in the past. She practiced using reacher to don pants this session with min assist. She also donned L sock with sock  aid with supervision. She needs min cues for safety as she attempted to push walker to the side before reaching back to sit in recliner. Cautioned pt to make sure she leaves the walker in front of her as she sits down for safety and then push it to the side if needed once she is seated. She also needed min cues to keep the walker on the floor at all times. She had some difficulty with rising from comfort height commode as it is lower than the 3in1. She states she has a tubseat and a tubbench as well. Discussed using tubbench initially for increased safety with tub transfers.      Vision Patient Visual Report: No change from baseline     Perception     Praxis      Cognition Arousal/Alertness: Awake/alert Behavior During Therapy: WFL for tasks assessed/performed Overall Cognitive Status: Within Functional Limits for tasks assessed                                          Exercises     Shoulder Instructions       General Comments  Pertinent Vitals/ Pain       Pain Assessment: 0-10 Pain Score: 3  Pain Location: L hip Pain Descriptors / Indicators: Aching;Sore Pain Intervention(s): Monitored during session;Ice applied  Home Living                                          Prior Functioning/Environment              Frequency  Min 2X/week        Progress Toward Goals  OT Goals(current goals can now be found in the care plan section)  Progress towards OT goals: Progressing toward goals     Plan Discharge plan needs to be updated    Co-evaluation                 AM-PAC PT "6 Clicks" Daily Activity     Outcome Measure   Help from another person eating meals?: None Help from another person taking care of personal grooming?: A Little Help from another person toileting, which includes using toliet, bedpan, or urinal?: A Little Help from another person bathing (including washing, rinsing, drying)?: A Little Help from  another person to put on and taking off regular upper body clothing?: None Help from another person to put on and taking off regular lower body clothing?: A Little 6 Click Score: 20    End of Session Equipment Utilized During Treatment: Rolling walker  OT Visit Diagnosis: Unsteadiness on feet (R26.81);Pain;Muscle weakness (generalized) (M62.81) Pain - Right/Left: Left Pain - part of body: Hip   Activity Tolerance Patient tolerated treatment well   Patient Left in chair;with call bell/phone within reach   Nurse Communication          Time: 2841-32441107-1155 OT Time Calculation (min): 48 min  Charges: OT General Charges $OT Visit: 1 Procedure OT Treatments $Self Care/Home Management : 8-22 mins $Therapeutic Activity: 23-37 mins     Zannie KehrStephanie S Xoey Warmoth 07/26/2017, 12:07 PM

## 2017-07-26 NOTE — Progress Notes (Signed)
Physical Therapy Treatment Patient Details Name: [name] [name] MRN: [id number] DOB: [birth year] Today's Date: 07/26/2017    History of Present Illness Pt is a 56 year old female s/p L direct anterior THA with hx of cervical and lumbar spine surgeries    PT Comments    Pt tolerated treatment well. Pt requires verbal cues with RW for sequencing and to limit UE fatigue.   Follow Up Recommendations  Home health PT     Equipment Recommendations  None recommended by PT    Recommendations for Other Services       Precautions / Restrictions Precautions Precautions: Fall Restrictions Weight Bearing Restrictions: No Other Position/Activity Restrictions: WBAT    Mobility  Bed Mobility Overal bed mobility: Needs Assistance Bed Mobility: Supine to Sit     Supine to sit: Min assist;HOB elevated     General bed mobility comments: PT OOB when PT arrived   Transfers Overall transfer level: Needs assistance Equipment used: Rolling walker (2 wheeled) Transfers: Sit to/from Stand Sit to Stand: Min guard         General transfer comment: Cues provided for hand placement on RW, min guard for safety  Ambulation/Gait Ambulation/Gait assistance: Min guard Ambulation Distance (Feet): 200 Feet Assistive device: Rolling walker (2 wheeled) Gait Pattern/deviations: Step-through pattern;Decreased stride length;Decreased stance time - left;Antalgic     General Gait Details: Pt provided with verbal cues for sequencing and to roll the RW and not lift it while ambulating    Stairs            Wheelchair Mobility    Modified Rankin (Stroke Patients Only)       Balance Overall balance assessment: Needs assistance Sitting-balance support: Feet supported Sitting balance-Leahy Scale: Good Sitting balance - Comments: Pt able to sit in chair with feet supported.    Standing balance support: Bilateral upper extremity supported;During functional activity Standing balance-Leahy Scale:  Fair Standing balance comment: Pt able to steady upon standing but requires support for functional activity and ambulation                             Cognition Arousal/Alertness: Awake/alert Behavior During Therapy: WFL for tasks assessed/performed Overall Cognitive Status: Within Functional Limits for tasks assessed                                        Exercises      General Comments        Pertinent Vitals/Pain Pain Assessment: 0-10 Pain Score: 3  Pain Location: L hip Pain Descriptors / Indicators: Aching;Sore Pain Intervention(s): Ice applied;Repositioned;Limited activity within patient's tolerance;Monitored during session;Premedicated before session;Patient requesting pain meds-RN notified    Home Living                      Prior Function            PT Goals (current goals can now be found in the care plan section) Progress towards PT goals: Progressing toward goals    Frequency    7X/week      PT Plan Current plan remains appropriate    Co-evaluation              AM-PAC PT "6 Clicks" Daily Activity  Outcome Measure  Difficulty turning over in bed (including adjusting bedclothes, sheets and blankets)?: A Lot Difficulty moving from  lying on back to sitting on the side of the bed? : Total Difficulty sitting down on and standing up from a chair with arms (e.g., wheelchair, bedside commode, etc,.)?: Total Help needed moving to and from a bed to chair (including a wheelchair)?: A Little Help needed walking in hospital room?: A Little Help needed climbing 3-5 steps with a railing? : A Little 6 Click Score: 13    End of Session Equipment Utilized During Treatment: Gait belt Activity Tolerance: Patient tolerated treatment well Patient left: in chair;with call bell/phone within reach;with nursing/sitter in room (RN in room to administer pain medicine ) Nurse Communication: Mobility status;Patient requests pain  meds PT Visit Diagnosis: Other abnormalities of gait and mobility (R26.89)     Time: 4098-11911205-1213 PT Time Calculation (min) (ACUTE ONLY): 8 min  Charges:  $Gait Training: 8-22 mins                    G Codes:       Marlene BastJoe Paiton B[place]tinghouse, SPT    Kathrene BongoJoseph Dres[place] Ament 07/26/2017, 2:09 PM

## 2017-07-26 NOTE — Care Management Note (Signed)
Case Management Note  Patient Details  Name: [name] [name] MRN: [id number] Date of Birth: 05/20/1961  Subjective/Objective:   S/p L THA                 Action/Plan: Discharge Planning: NCM spoke to pt at bedside. States she lives far out and is declining HH at this time. She has RW and bedside commode at home. States her husband, son and sister-in-law will be there to assist with her care.     Expected Discharge Date:  07/27/2017              Expected Discharge Plan:  Home w Home Health Services  In-House Referral:  NA  Discharge planning Services  CM Consult  Post Acute Care Choice:  Home Health Choice offered to:  Patient  DME Arranged:  N/A DME Agency:  NA  HH Arranged:  Patient Refused HH HH Agency:  NA  Status of Service:  Completed, signed off  If discussed at Long Length of Stay Meetings, dates discussed:    Additional Comments:  Elliot CousinShavis, [name] Denherder Ellen, RN 07/26/2017, 9:33 AM

## 2017-07-26 NOTE — Progress Notes (Signed)
Physical Therapy Treatment Patient Details Name: [name] [name] MRN: [id number] DOB: 07/01/1961 Today's Date: 07/26/2017    History of Present Illness Pt is a 56 year old female s/p L direct anterior THA with hx of cervical and lumbar spine surgeries    PT Comments    Pt limited by pain this afternoon. Pt reports pain 5/10 but reports unwilling and unable to ambulate at this time without additional medications. Pt was willing to attempt and able to perform supine exercises.   Follow Up Recommendations  Home health PT     Equipment Recommendations  None recommended by PT    Recommendations for Other Services       Precautions / Restrictions Precautions Precautions: Fall Restrictions Weight Bearing Restrictions: No Other Position/Activity Restrictions: WBAT    Mobility    Balance   Cognition Arousal/Alertness: Awake/alert Behavior During Therapy: WFL for tasks assessed/performed Overall Cognitive Status: Within Functional Limits for tasks assessed                                        Exercises Total Joint Exercises Ankle Circles/Pumps: AROM;20 reps;Both;Supine Quad Sets: 15 reps;AROM;Both;Supine Heel Slides: Left;10 reps;AAROM;Supine Hip ABduction/ADduction: AAROM;10 reps;Left;Supine    General Comments        Pertinent Vitals/Pain Pain Assessment: 0-10 Pain Score: 5  Pain Location: L Hip Pain Descriptors / Indicators: Aching;Sore Pain Intervention(s): Limited activity within patient's tolerance;Monitored during session;Patient requesting pain meds-RN notified;Ice applied;Premedicated before session    Home Living                      Prior Function            PT Goals (current goals can now be found in the care plan section) Progress towards PT goals: Progressing toward goals    Frequency    7X/week      PT Plan Current plan remains appropriate    Co-evaluation              AM-PAC PT "6 Clicks" Daily Activity   Outcome Measure  Difficulty turning over in bed (including adjusting bedclothes, sheets and blankets)?: A Lot Difficulty moving from lying on back to sitting on the side of the bed? : Total Difficulty sitting down on and standing up from a chair with arms (e.g., wheelchair, bedside commode, etc,.)?: Total Help needed moving to and from a bed to chair (including a wheelchair)?: A Little Help needed walking in hospital room?: A Little Help needed climbing 3-5 steps with a railing? : A Little 6 Click Score: 13    End of Session Equipment Utilized During Treatment: Gait belt Activity Tolerance: Patient limited by pain Patient left: in bed;with call bell/phone within reach Nurse Communication: Patient requests pain meds PT Visit Diagnosis: Other abnormalities of gait and mobility (R26.89)     Time: 4098-11911456-1508 PT Time Calculation (min) (ACUTE ONLY): 12 min  Charges:   $Therapeutic Exercise: 8-22 mins                    G Codes:       Marlene BastJoe Brinkley Peet, SPT    Kathrene BongoJoseph Kattleya Kuhnert 07/26/2017, 3:56 PM

## 2017-07-26 NOTE — Progress Notes (Signed)
Subjective: 2 Days Post-Op Procedure(s) (LRB): LEFT TOTAL HIP ARTHROPLASTY ANTERIOR APPROACH (Left) Patient reports pain as moderate.    Objective: Vital signs in last 24 hours: Temp:  [98.2 F (36.8 C)-98.8 F (37.1 C)] 98.3 F (36.8 C) (08/12 0536) Pulse Rate:  [85-93] 85 (08/12 0536) Resp:  [15-16] 16 (08/12 0536) BP: (116-147)/(58-89) 116/58 (08/12 0536) SpO2:  [98 %-100 %] 98 % (08/12 0536)  Intake/Output from previous day: 08/11 0701 - 08/12 0700 In: 892.5 [P.O.:480; I.V.:412.5] Out: 1502 [Urine:1502] Intake/Output this shift: Total I/O In: -  Out: 300 [Urine:300]   Recent Labs  07/25/17 0531  HGB 11.2*    Recent Labs  07/25/17 0531  WBC 8.5  RBC 4.13  HCT 33.9*  PLT 237    Recent Labs  07/25/17 0531  NA 139  K 3.8  CL 106  CO2 25  BUN 9  CREATININE 0.58  GLUCOSE 117*  CALCIUM 8.9   No results for input(s): LABPT, INR in the last 72 hours.  Sensation intact distally Intact pulses distally Dorsiflexion/Plantar flexion intact Incision: dressing C/D/I  Assessment/Plan: 2 Days Post-Op Procedure(s) (LRB): LEFT TOTAL HIP ARTHROPLASTY ANTERIOR APPROACH (Left) Up with therapy Plan for discharge tomorrow Discharge home with home health  Kathryne HitchChristopher Y Terel Bann 07/26/2017, 9:48 AM

## 2017-07-26 NOTE — Progress Notes (Signed)
Pt c/o severe pain unrelieved by Oxycodone and Soma.  Pt states she doesn't take Flexeril.  IV infiltrated.  Dr. Magnus IvanBlackman notified. No new orders received.  Will continue to give meds as ordered and keep ice to left hip

## 2017-07-27 NOTE — Progress Notes (Signed)
Patient ID: [name] PeterLisa Holderness, female   DOB: 12/14/1961, 56 y.o.   MRN: [id number] No acute changes.  Difficulty with pain control given her narcotics use in the past.  Vitals stable and hip stable.  Can be discharged to home today.

## 2017-07-27 NOTE — Progress Notes (Signed)
Occupational Therapy Treatment Patient Details Name: [name] [name] MRN: [id number] DOB: [birth year] Today's Date: 07/27/2017    History of present illness Pt is a 56 year old female s/p L direct anterior THA with hx of cervical and lumbar spine surgeries   OT comments  Pt progressing towards goals. Reviewed AE for LB dressing and educated on safe transfer technique to walk-in shower using RW with Pt verbalizing understanding throughout. Pt reports feeling comfortable completing ADLs and functional mobility after return home. Questions answered throughout. Will continue to follow to maximize Pt's safety and independence with ADLs and functional mobility.    Follow Up Recommendations  Home health OT    Equipment Recommendations  None recommended by OT          Precautions / Restrictions Precautions Precautions: Fall Restrictions Weight Bearing Restrictions: No Other Position/Activity Restrictions: WBAT       Mobility Bed Mobility Overal bed mobility: Needs Assistance Bed Mobility: Supine to Sit     Supine to sit: Min guard     General bed mobility comments: HOB elevated, close guard for safety   Transfers Overall transfer level: Needs assistance Equipment used: Rolling walker (2 wheeled) Transfers: Sit to/from Stand Sit to Stand: Min guard         General transfer comment: min guard for safety     Balance Overall balance assessment: Needs assistance Sitting-balance support: Feet supported Sitting balance-Leahy Scale: Good     Standing balance support: Bilateral upper extremity supported;During functional activity Standing balance-Leahy Scale: Fair Standing balance comment: reliant on UE support during mobility                            ADL either performed or assessed with clinical judgement   ADL Overall ADL's : Needs assistance/impaired                       Lower Body Dressing Details (indicate cue type and reason): Pt verbalized  understanding and feeling comfortable using AE for completing LB dressing            Tub/Shower Transfer Details (indicate cue type and reason): Pt reports she uses her walk-in shower (vs tub); educated Pt on safety transfer technique to/from walk-in shower to shower chair using RW with Pt verbalizing understanding  Functional mobility during ADLs: Min guard;Rolling walker                         Cognition Arousal/Alertness: Awake/alert Behavior During Therapy: WFL for tasks assessed/performed Overall Cognitive Status: Within Functional Limits for tasks assessed                                                            Pertinent Vitals/ Pain       Pain Assessment: Faces Faces Pain Scale: Hurts a little bit Pain Location: L Hip Pain Descriptors / Indicators: Aching;Sore Pain Intervention(s): Limited activity within patient's tolerance;Ice applied;Repositioned;Monitored during session  Frequency  Min 2X/week        Progress Toward Goals  OT Goals(current goals can now be found in the care plan section)  Progress towards OT goals: Progressing toward goals  Acute Rehab OT Goals Patient Stated Goal: Decrease pain and go home OT Goal Formulation: With patient Time For Goal Achievement: 08/08/17 Potential to Achieve Goals: Good  Plan Discharge plan remains appropriate                     AM-PAC PT "6 Clicks" Daily Activity     Outcome Measure   Help from another person eating meals?: None Help from another person taking care of personal grooming?: A Little Help from another person toileting, which includes using toliet, bedpan, or urinal?: A Little Help from another person bathing (including washing, rinsing, drying)?: A Little   Help from another person to put on and taking off regular lower body clothing?: A Little 6 Click Score: 16    End  of Session Equipment Utilized During Treatment: Rolling walker  OT Visit Diagnosis: Unsteadiness on feet (R26.81);Pain;Muscle weakness (generalized) (M62.81) Pain - Right/Left: Left Pain - part of body: Hip   Activity Tolerance Patient tolerated treatment well   Patient Left in chair;with call bell/phone within reach;with chair alarm set             Time: 4098-11910914-0935 OT Time Calculation (min): 21 min  Charges: OT General Charges $OT Visit: 1 Procedure OT Treatments $Self Care/Home Management : 8-22 mins  Marcy SirenBreanna Dayvian Blixt, OT Pager [phone] 07/27/2017    Orlando PennerBreanna L Alizee Maple 07/27/2017, 10:05 AM

## 2017-07-27 NOTE — Progress Notes (Signed)
PT Cancellation Note  Patient Details Name: [name] [name] MRN: [id number] DOB: 04/06/1961   Cancelled Treatment:    Reason Eval/Treat Not Completed: Other (comment) pt declines to practice stairs or other activity stating she doesn't want to get her "pain out of control" and that she did too much yesterday   [facility] In[facility] HospitalWILLIAMS,[name] 07/27/2017, 11:27 AM

## 2017-07-27 NOTE — Care Management Important Message (Signed)
Important Message  Patient Details  Name: [name] [name] MRN: [id number] Date of Birth: 11/20/1961   Medicare Important Message Given:  Yes    Caren MacadamFuller, Danniel Grenz 07/27/2017, 11:28 AMImportant Message  Patient Details  Name: [name] [name] MRN: [id number] Date of Birth: 10/24/1961   Medicare Important Message Given:  Yes    Caren MacadamFuller, Alisah Grandberry 07/27/2017, 11:28 AM

## 2017-07-27 NOTE — Discharge Summary (Signed)
Patient ID: [name] [name] MRN: [id number] DOB/AGE: 02-05-1961 56 y.o.  Admit date: 07/24/2017 Discharge date: 07/27/2017  Admission Diagnoses:  Principal Problem:   Unilateral primary osteoarthritis, left hip Active Problems:   Status post total replacement of left hip   Discharge Diagnoses:  Same  Past Medical History:  Diagnosis Date  . Arthritis   . Environmental allergies   . History of bronchitis   . Nerve damage    hx of cervical and lumbar surgeries   . PONV (postoperative nausea and vomiting)     Surgeries: Procedure(s): LEFT TOTAL HIP ARTHROPLASTY ANTERIOR APPROACH on 07/24/2017   Consultants:   Discharged Condition: Improved  Hospital Course: [name] [name] is an 56 y.o. female who was admitted 07/24/2017 for operative treatment ofUnilateral primary osteoarthritis, left hip. Patient has severe unremitting pain that affects sleep, daily activities, and work/hobbies. After pre-op clearance the patient was taken to the operating room on 07/24/2017 and underwent  Procedure(s): LEFT TOTAL HIP ARTHROPLASTY ANTERIOR APPROACH.    Patient was given perioperative antibiotics: Anti-infectives    Start     Dose/Rate Route Frequency Ordered Stop   07/24/17 1600  ceFAZolin (ANCEF) IVPB 1 g/50 mL premix     1 g 100 mL/hr over 30 Minutes Intravenous Every 6 hours 07/24/17 1246 07/24/17 2203   07/24/17 0600  ceFAZolin (ANCEF) 3 g in dextrose 5 % 50 mL IVPB     3 g 130 mL/hr over 30 Minutes Intravenous On call to O.R. 07/23/17 1229 07/24/17 1012       Patient was given sequential compression devices, early ambulation, and chemoprophylaxis to prevent DVT.  Patient benefited maximally from hospital stay and there were no complications.    Recent vital signs: Patient Vitals for the past 24 hrs:  BP Temp Temp src Pulse Resp SpO2  07/27/17 0611 133/84 98 F (36.7 C) - 86 17 95 %  07/26/17 2300 130/60 98.1 F (36.7 C) Oral 84 18 95 %  07/26/17 1400 (!) 125/55 98 F (36.7 C) - 88 18  96 %     Recent laboratory studies:  Recent Labs  07/25/17 0531  WBC 8.5  HGB 11.2*  HCT 33.9*  PLT 237  NA 139  K 3.8  CL 106  CO2 25  BUN 9  CREATININE 0.58  GLUCOSE 117*  CALCIUM 8.9     Discharge Medications:   Allergies as of 07/27/2017      Reactions   Monosodium Glutamate Swelling   Nsaids Swelling, Rash   Edema in eyes ,hands and feet Feet,  ankles   Pregabalin Swelling   Ankle swelling   Celecoxib Swelling      Medication List    TAKE these medications   albuterol 108 (90 Base) MCG/ACT inhaler Commonly known as:  PROVENTIL HFA;VENTOLIN HFA Inhale 2 puffs into the lungs every 6 (six) hours as needed for wheezing or shortness of breath.   carisoprodol 350 MG tablet Commonly known as:  SOMA Take 350 mg by mouth 2 (two) times daily.   cyclobenzaprine 10 MG tablet Commonly known as:  FLEXERIL Take 1 tablet (10 mg total) by mouth 3 (three) times daily as needed for muscle spasms. for pain   furosemide 20 MG tablet Commonly known as:  LASIX Take 1 tablet (20 mg total) by mouth daily. What changed:  when to take this  reasons to take this   ibuprofen 200 MG tablet Commonly known as:  ADVIL,MOTRIN Take 600 mg by mouth 2 (two) times daily.  omeprazole 20 MG capsule Commonly known as:  PRILOSEC Take 20 mg by mouth daily as needed (heartburn).   oxyCODONE 15 MG immediate release tablet Commonly known as:  ROXICODONE Take 1 tablet (15 mg total) by mouth every 4 (four) hours as needed for pain. for pain What changed:  when to take this  reasons to take this   rivaroxaban 10 MG Tabs tablet Commonly known as:  XARELTO Take 1 tablet (10 mg total) by mouth daily with breakfast.   Vitamin D (Ergocalciferol) 50000 units Caps capsule Commonly known as:  DRISDOL Take 50,000 Units by mouth once a week. Monday            Durable Medical Equipment        Start     Ordered   07/24/17 1247  DME 3 n 1  Once     07/24/17 1246   07/24/17 1247   DME Walker rolling  Once    Question:  Patient needs a walker to treat with the following condition  Answer:  Status post total replacement of left hip   07/24/17 1246      Diagnostic Studies: Dg Pelvis Portable  Result Date: 07/24/2017 CLINICAL DATA:  Status post left hip replacement. EXAM: PORTABLE PELVIS 1-2 VIEWS COMPARISON:  Radiographs of June 08, 2017. FINDINGS: The femoral and acetabular components appear to be well situated. No fracture or dislocation is noted. Expected postoperative changes are seen in the adjacent soft tissues. IMPRESSION: Status post left total hip arthroplasty. Electronically Signed   By: Lupita RaiderJames  Green Jr, M.D.   On: 07/24/2017 12:05   Dg C-arm 1-60 Min-no Report  Result Date: 07/24/2017 Fluoroscopy was utilized by the requesting physician.  No radiographic interpretation.   Dg Hip Operative Unilat W Or W/o Pelvis Left  Result Date: 07/24/2017 CLINICAL DATA:  LEFT total hip replacement EXAM: OPERATIVE LEFT HIP (WITH PELVIS IF PERFORMED) 3 VIEWS TECHNIQUE: Fluoroscopic spot image(s) were submitted for interpretation post-operatively. COMPARISON:  06/08/2017 FLUOROSCOPY TIME:  0 minutes 30 seconds Images obtained: 3 FINDINGS: Components of a LEFT hip prosthesis in expected position. No acute fracture or dislocation. Bones demineralized. IMPRESSION: LEFT prosthesis without acute complication. Electronically Signed   By: Ulyses SouthwardMark  Boles M.D.   On: 07/24/2017 11:41    Disposition: 01-Home or Self Care  Discharge Instructions    Discharge patient    Complete by:  As directed    Discharge disposition:  01-Home or Self Care   Discharge patient date:  07/27/2017      Follow-up Information    Kathryne HitchBlackman, Keelan Pomerleau Y, MD Follow up in 2 week(s).   Specialty:  Orthopedic Surgery Contact information: [address] [place] [address] [phone]            Signed: Kathryne HitchChristopher Y Tysheena Ginzburg 07/27/2017, 7:02 AM

## 2017-07-30 ENCOUNTER — Telehealth (INDEPENDENT_AMBULATORY_CARE_PROVIDER_SITE_OTHER): Payer: Self-pay | Admitting: Orthopaedic Surgery

## 2017-07-30 NOTE — Telephone Encounter (Signed)
It is okay to fax a note to that office stating that the patient is post total hip replacement and we will be providing her some narcotics for the short-term.

## 2017-07-30 NOTE — Telephone Encounter (Signed)
Patient called needing note faxed over to Dr Hinton DyerPenny Popes office stating she's under Dr Eliberto IvoryBlackman's care until further notice. The fax# is [phone]   The number to contact patient is [phone]

## 2017-07-31 ENCOUNTER — Telehealth (INDEPENDENT_AMBULATORY_CARE_PROVIDER_SITE_OTHER): Payer: Self-pay | Admitting: Orthopaedic Surgery

## 2017-07-31 MED ORDER — RIVAROXABAN 10 MG PO TABS: 10.0000 mg | ORAL_TABLET | Freq: Every day | ORAL | 0 refills | Status: AC

## 2017-07-31 NOTE — Telephone Encounter (Signed)
Faxed to provided number  

## 2017-07-31 NOTE — Telephone Encounter (Signed)
Received call from patient left voicemail message stating she can not fine her blood thinner. Patient asked if she can get another Rx. The number to contact patient is [phone]

## 2017-07-31 NOTE — Telephone Encounter (Signed)
Please send in to her pharmacy

## 2017-08-03 NOTE — Telephone Encounter (Signed)
Patient aware of the below message  

## 2017-08-03 NOTE — Telephone Encounter (Signed)
At this point, if she can tolerate just a daily 325 mg aspirin, that would work since she is already 2 weeks out frm her surgery.

## 2017-08-06 ENCOUNTER — Ambulatory Visit (INDEPENDENT_AMBULATORY_CARE_PROVIDER_SITE_OTHER): Payer: Medicare Other | Admitting: Orthopaedic Surgery

## 2017-08-06 DIAGNOSIS — Z96642 Presence of left artificial hip joint: Secondary | ICD-10-CM

## 2017-08-06 MED ORDER — OXYCODONE HCL 15 MG PO TABS: 15.0000 mg | ORAL_TABLET | ORAL | 0 refills | Status: AC | PRN

## 2017-08-06 NOTE — Progress Notes (Signed)
The patient is 2 weeks tomorrow status post a left total hip arthroplasty through direct injury approach. She is doing well overall. She family with a rolling walker. She is on chronic narcotic pain medication and does need one more prescription for me today before being released back to her physician who provides this narcotics for her. She is hoping to be transition to Suboxone. She says her hip is doing well.  On examination of her left hip her leg lengths are near equal. She is slightly longer on the left than the right. Her incision looks good term and staples and placed Steri-Strips. There is a slight area at the very top the knee-chest some triple antibiotic comment on a daily shoulder this. I did drain about 60 mL of seroma from around her hip. There is no evidence infection.  She'll continue increase her activities as comfort allows. I did refill her oxycodone a day for 90 pills for last time for me. We'll see her back in a month to see how she doing overall. No x-rays are needed.

## 2017-09-07 ENCOUNTER — Ambulatory Visit (INDEPENDENT_AMBULATORY_CARE_PROVIDER_SITE_OTHER): Payer: Medicare Other | Admitting: Orthopaedic Surgery

## 2017-09-07 DIAGNOSIS — Z96642 Presence of left artificial hip joint: Secondary | ICD-10-CM

## 2017-09-07 NOTE — Progress Notes (Signed)
The patient is 45 days into a left total hip arthroplasty. She does have a length discrepancy with her right hip being slightly shorter. This is throwing off her gait. She is someone in chronic pain management having had multiple back surgeries in the past.  On examination laying flat she does have valgus malalignment of both knees. Her left operative hip is doing well. She is slightly longer on the left than the right.  I will [facility] with a prescription for a shoe buildup versus an insert that will hopefully help balance her better. So before the right side. I'll see her back in a month I would like a standing low AP pelvis at that visit.

## 2017-09-08 ENCOUNTER — Telehealth (INDEPENDENT_AMBULATORY_CARE_PROVIDER_SITE_OTHER): Payer: Self-pay | Admitting: Orthopaedic Surgery

## 2017-09-08 NOTE — Telephone Encounter (Signed)
Patient called asking for a temporary handicap placard since it's very painful to walk long distances. She has an appointment at [facility] at 1 today and was wondering if she could come by today to pick that up. CB #  [phone]

## 2017-09-08 NOTE — Telephone Encounter (Signed)
Patient aware handicao ready at front desk

## 2017-09-08 NOTE — Telephone Encounter (Signed)
Okay to give temporary handicap form to her.

## 2017-09-08 NOTE — Telephone Encounter (Signed)
Please advise  (under the phone to sign if you decide to do so)

## 2017-10-05 ENCOUNTER — Ambulatory Visit (INDEPENDENT_AMBULATORY_CARE_PROVIDER_SITE_OTHER): Payer: Medicare Other | Admitting: Orthopaedic Surgery

## 2017-10-05 ENCOUNTER — Ambulatory Visit (INDEPENDENT_AMBULATORY_CARE_PROVIDER_SITE_OTHER): Payer: Medicare Other

## 2017-10-05 DIAGNOSIS — Z96642 Presence of left artificial hip joint: Secondary | ICD-10-CM | POA: Diagnosis not present

## 2017-10-05 NOTE — Progress Notes (Signed)
The patient is now about 11 weeks into a total hip arthroplasty on the left side.  She is doing well overall.  She did get insert for her right side because she felt like her leg lengths were different with the left being slightly longer than the right.  She says her pain is decreasing significantly.  She says she is doing well overall.  On exam I can easily put her left operative field the range of motion with no difficulty at all.  An AP pelvis was assessed and she has a well-seated implant on the left side with no  complicating features.  Actually measured her leg lengths and they appear equal.  At this point she will continue to increase her activity as comfort allows.  I will see her back in 6 months to see how she is doing overall and would like a standing low AP pelvis at that visit.

## 2017-11-26 ENCOUNTER — Telehealth (INDEPENDENT_AMBULATORY_CARE_PROVIDER_SITE_OTHER): Payer: Self-pay | Admitting: Orthopaedic Surgery

## 2017-11-26 NOTE — Telephone Encounter (Signed)
For any reason were you wanting her to continue antibiotics after 3 months?

## 2017-11-26 NOTE — Telephone Encounter (Signed)
Patient called asked if Dr Magnus IvanBlackman will have note faxed over stating she need an antibiotic prior to seeing the dentist. The fax number is [phone]  Ph# is [phone] Dr. Moises Bloodadford's office

## 2017-12-01 ENCOUNTER — Telehealth (INDEPENDENT_AMBULATORY_CARE_PROVIDER_SITE_OTHER): Payer: Self-pay | Admitting: Orthopaedic Surgery

## 2017-12-01 NOTE — Telephone Encounter (Signed)
  For any reason were you wanting her to continue antibiotics after 3 months?

## 2017-12-01 NOTE — Telephone Encounter (Signed)
Patient called advised she has a broken tooth and need to see the dentist as soon as possible. Patient advised the tooth is cutting into her jaw. The number to contact patient is [phone]

## 2017-12-02 NOTE — Telephone Encounter (Signed)
Does not need antibiotics from my standpoint 

## 2017-12-02 NOTE — Telephone Encounter (Signed)
LMOM for patient of the below message  

## 2018-02-24 ENCOUNTER — Telehealth (INDEPENDENT_AMBULATORY_CARE_PROVIDER_SITE_OTHER): Payer: Self-pay | Admitting: Orthopaedic Surgery

## 2018-02-24 NOTE — Telephone Encounter (Signed)
Patient called asked if a note can be faxed to her dentist stating she can be given antibiotics by her dentist Dr. Everlena Cooperadford. Patient said the doctor will not see her unless she takes the antibiotics. The fax# is [phone]   The number to contact patient is [phone]

## 2018-02-24 NOTE — Telephone Encounter (Signed)
Faxed

## 2018-03-04 ENCOUNTER — Ambulatory Visit (INDEPENDENT_AMBULATORY_CARE_PROVIDER_SITE_OTHER): Payer: 59

## 2018-03-04 ENCOUNTER — Ambulatory Visit (INDEPENDENT_AMBULATORY_CARE_PROVIDER_SITE_OTHER): Payer: 59 | Admitting: Physician Assistant

## 2018-03-04 ENCOUNTER — Encounter (INDEPENDENT_AMBULATORY_CARE_PROVIDER_SITE_OTHER): Payer: Self-pay | Admitting: Physician Assistant

## 2018-03-04 DIAGNOSIS — M79604 Pain in right leg: Secondary | ICD-10-CM

## 2018-03-04 DIAGNOSIS — Z96642 Presence of left artificial hip joint: Secondary | ICD-10-CM | POA: Diagnosis not present

## 2018-03-04 MED ORDER — METHYLPREDNISOLONE 4 MG PO TABS: ORAL_TABLET | ORAL | 0 refills | Status: AC

## 2018-03-04 MED ORDER — HYDROCODONE-ACETAMINOPHEN 10-325 MG PO TABS: 1.0000 | ORAL_TABLET | Freq: Four times a day (QID) | ORAL | 0 refills | Status: DC | PRN

## 2018-03-04 NOTE — Progress Notes (Signed)
Office Visit Note   Patient: [name] [name]           Date of Birth: 07/17/1961           MRN: [id number] Visit Date: 03/04/2018              Requested by: No referring provider defined for this encounter. PCP: Patient, No Pcp Per   Assessment & Plan: Visit Diagnoses:  1. History of total hip arthroplasty, left   2. Pain in right leg     Plan: We will place her on a Medrol Dosepak, she has Zanaflex at home to take for muscle relaxant.  She states she is unable take NSAIDs.  Did give her a small quantity of hydrocodone after narcotics background check.  She will follow-up with Dr. Tyrone Sage for her back.  She will follow-up with [place] in approximately 3 months for follow-up of her left total hip arthroplasty.  Questions encouraged and answered at length.  Follow-Up Instructions: Return in about 1 month (around 04/04/2018).   Orders:  Orders Placed This Encounter  Procedures  . XR Pelvis 1-2 Views  . XR Lumbar Spine 2-3 Views  . XR Lumb Spine Flex&Ext Only   Meds ordered this encounter  Medications  . HYDROcodone-acetaminophen (NORCO) 10-325 MG tablet    Sig: Take 1 tablet by mouth every 6 (six) hours as needed.    Dispense:  40 tablet    Refill:  0  . methylPREDNISolone (MEDROL) 4 MG tablet    Sig: Take as directed    Dispense:  21 tablet    Refill:  0      Procedures: No procedures performed   Clinical Data: No additional findings.   Subjective: Chief Complaint  Patient presents with  . Left Hip - Follow-up    HPI [name] [name] 57 year old female well-known to Dr. Magnus Ivan service comes in today complaining of low back pain.  She 7 months out from a left total hip arthroplasty.  Left hip overall is doing well.  Her main complaint is right leg pain.  She states her right knee goes numb she has radiation of pain from her back down to her right thigh.'s been ongoing for the past 3 months.  She was due to see her neurosurgeon Dr.Lyerly but had some vehicle problems and one is  that he is unable to make the appointment, now is unable to get rescheduled with them for at least 2 months.  Dr. 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  She states she is having severe pain.  She is very tearful.  Review of Systems No bowel bladder dysfunction.  No fevers chills.  Positive for radicular symptoms right thigh to the knee and low back pain on the right side.  Objective: Vital Signs: There were no vitals taken for this visit.  Physical Exam  Constitutional: She is oriented to person, place, and time. She appears well-developed and well-nourished.  Tearful  Cardiovascular: Intact distal pulses.  Pulmonary/Chest: Effort normal.  Neurological: She is alert and oriented to person, place, and time.  Skin: She is not diaphoretic.  Psychiatric: She has a normal mood and affect.    Ortho Exam Lower extremity she has 5 out of 5 strength throughout against resistance.  Positive straight leg raise on the right negative on the left.  Tight hamstrings bilaterally.  She is able to walk on her tiptoes has some difficulty walking on her heels but is able  to dorsiflex plantarflex both feet.  Deep tendon reflexes are 2+ at the knees and 1+ at the ankles and equal and symmetric.  She has tenderness over the right lower paraspinous region.  Good range of motion of both hips without pain.  Nontender over the greater trochanteric region of both hips. Specialty Comments:  No specialty comments available.  Imaging: Xr Lumb Spine Flex&ext Only  Result Date: 03/04/2018 Flexion extension lumbar: No gross instability at L2-3 or L3-4.  Otherwise mild degenerative changes.  Xr Lumbar Spine 2-3 Views  Result Date: 03/04/2018 AP and lateral views of the lumbar spine show mild degenerative changes L2 through L5.  Grade 1 retrolisthesis L2 on 3 and a grade 1 anterior listhesis L3 on 4.  Loss of lordotic curvature is noted.  No acute fracture.  Xr Pelvis  1-2 Views  Result Date: 03/04/2018 AP pelvis: No acute fractures.  Status post left total hip arthroplasty with well-seated components.  Bilateral hips are well located.    PMFS History: Patient Active Problem List   Diagnosis Date Noted  . Unilateral primary osteoarthritis, left hip 07/24/2017  . Status post total replacement of left hip 07/24/2017   Past Medical History:  Diagnosis Date  . Arthritis   . Environmental allergies   . History of bronchitis   . Nerve damage    hx of cervical and lumbar surgeries   . PONV (postoperative nausea and vomiting)     No family history on file.  Past Surgical History:  Procedure Laterality Date  . ABDOMINAL HYSTERECTOMY    . arm surgery    . BACK SURGERY     times 3  . FOOT SURGERY    . NECK SURGERY    . TOTAL HIP ARTHROPLASTY Left 07/24/2017   Procedure: LEFT TOTAL HIP ARTHROPLASTY ANTERIOR APPROACH;  Surgeon: Kathryne HitchBlackman, Christopher Y, MD;  Location: [facility];  Service: Orthopedics;  Laterality: Left;   Social History   Occupational History  . Not on file  Tobacco Use  . Smoking status: Current Every Day Smoker    Packs/day: 0.50    Years: 30.00    Pack years: 15.00    Types: Cigarettes  . Smokeless tobacco: Never Used  Substance and Sexual Activity  . Alcohol use: No  . Drug use: No    Comment: THC oil ; last used over 3 weeks ago   . Sexual activity: Not on file

## 2018-03-29 ENCOUNTER — Ambulatory Visit (INDEPENDENT_AMBULATORY_CARE_PROVIDER_SITE_OTHER): Payer: 59

## 2018-03-29 ENCOUNTER — Ambulatory Visit (INDEPENDENT_AMBULATORY_CARE_PROVIDER_SITE_OTHER): Payer: 59 | Admitting: Orthopaedic Surgery

## 2018-03-29 ENCOUNTER — Encounter (INDEPENDENT_AMBULATORY_CARE_PROVIDER_SITE_OTHER): Payer: Self-pay | Admitting: Orthopaedic Surgery

## 2018-03-29 DIAGNOSIS — Z96642 Presence of left artificial hip joint: Secondary | ICD-10-CM | POA: Diagnosis not present

## 2018-03-29 MED ORDER — HYDROCODONE-ACETAMINOPHEN 10-325 MG PO TABS: 1.0000 | ORAL_TABLET | Freq: Four times a day (QID) | ORAL | 0 refills | Status: AC | PRN

## 2018-03-29 NOTE — Progress Notes (Signed)
The patient comes in today with continued debilitating low back pain with radicular symptoms going down both her legs.  She has had back surgery in the past and is still working to get into her back specialist.  She is also been on and off chronic pain medication in the past.  She does request some today.  On exam of the left hip and exam of the left hip itself is normal.  There is no pain of the trochanteric area or in the groin at all and x-rays of the pelvis and left hip shows no complicating features are hip replacement at all.  Previous x-rays of the lumbar spine show significant degenerative changes at multiple levels with anterolisthesis at L3 on L4.  At this point I will give her just 20 hydrocodone as we will not refill this.  From a hip standpoint I do not need to see her back for 6 months with just a low AP pelvis.

## 2018-04-05 ENCOUNTER — Ambulatory Visit (INDEPENDENT_AMBULATORY_CARE_PROVIDER_SITE_OTHER): Payer: 59 | Admitting: Orthopaedic Surgery

## 2018-04-05 ENCOUNTER — Ambulatory Visit (INDEPENDENT_AMBULATORY_CARE_PROVIDER_SITE_OTHER): Payer: Medicare Other | Admitting: Orthopaedic Surgery

## 2018-04-21 ENCOUNTER — Telehealth (INDEPENDENT_AMBULATORY_CARE_PROVIDER_SITE_OTHER): Payer: Self-pay | Admitting: Orthopaedic Surgery

## 2018-04-21 NOTE — Telephone Encounter (Signed)
I called and LMOM that her CD is ready for pick up at the front desk

## 2018-04-21 NOTE — Telephone Encounter (Signed)
She would like a CD and she wants all images of her back. Thank you

## 2018-04-21 NOTE — Telephone Encounter (Signed)
See note, can you make a cd please?

## 2018-04-21 NOTE — Telephone Encounter (Signed)
Patient called asking for a copy of her xrays taken of her back to give to another doctor. If they could be printed off, please give her a call when ready. Thank you. CB # [phone]

## 2018-04-21 NOTE — Telephone Encounter (Signed)
Called patient no answer. LMOM. Just want to verify if she just wants paper copy xrays or a CD. And which body part?

## 2018-06-23 IMAGING — RF DG HIP (WITH PELVIS) OPERATIVE*L*
1 series · 3 of 3 positions shown · non-contrast
Comparison: 06/08/2017

FLUOROSCOPY TIME:  0 minutes 30 seconds

Images obtained: 3

CLINICAL DATA: LEFT total hip replacement

EXAM:
OPERATIVE LEFT HIP (WITH PELVIS IF PERFORMED) 3 VIEWS
TECHNIQUE: Fluoroscopic spot image(s) were submitted for interpretation
post-operatively.

[Series 1: run · 3 of 3 slices shown]
[im 1/3]
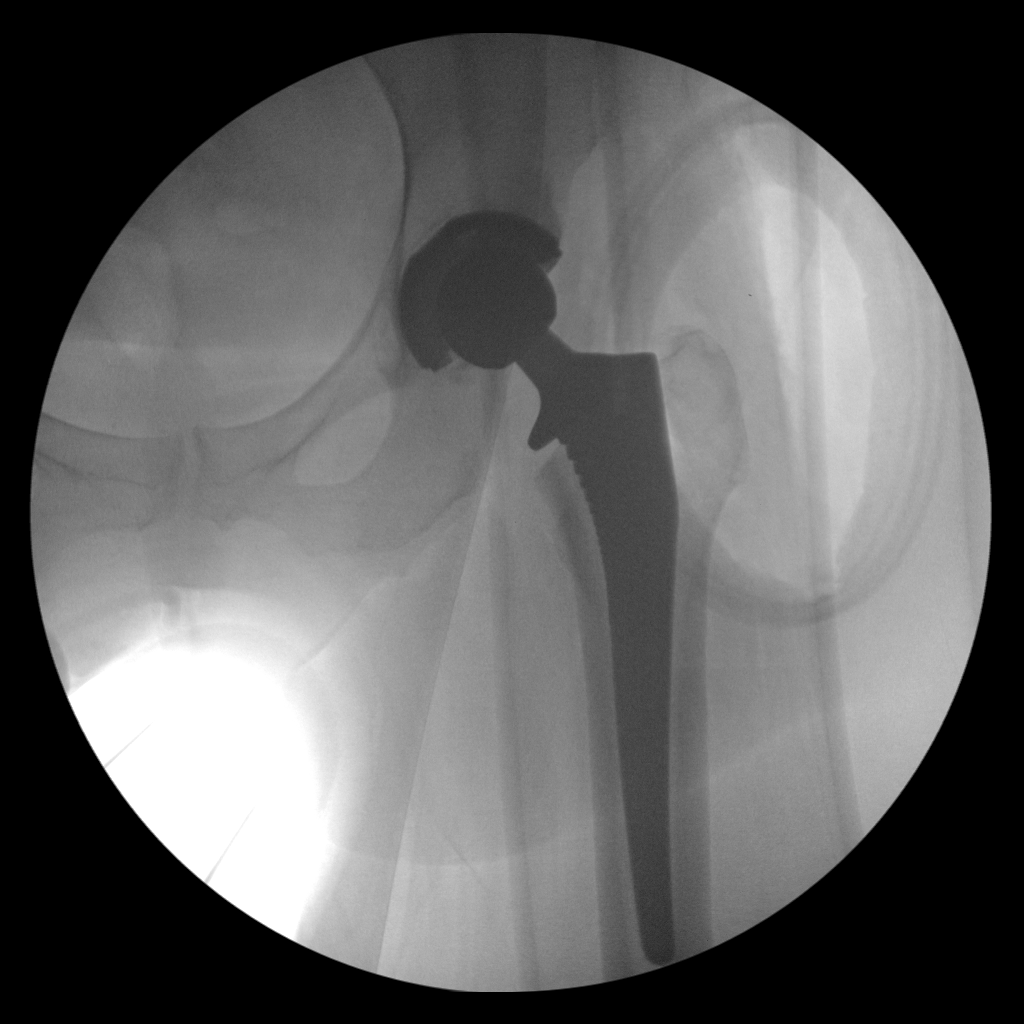
[im 2/3]
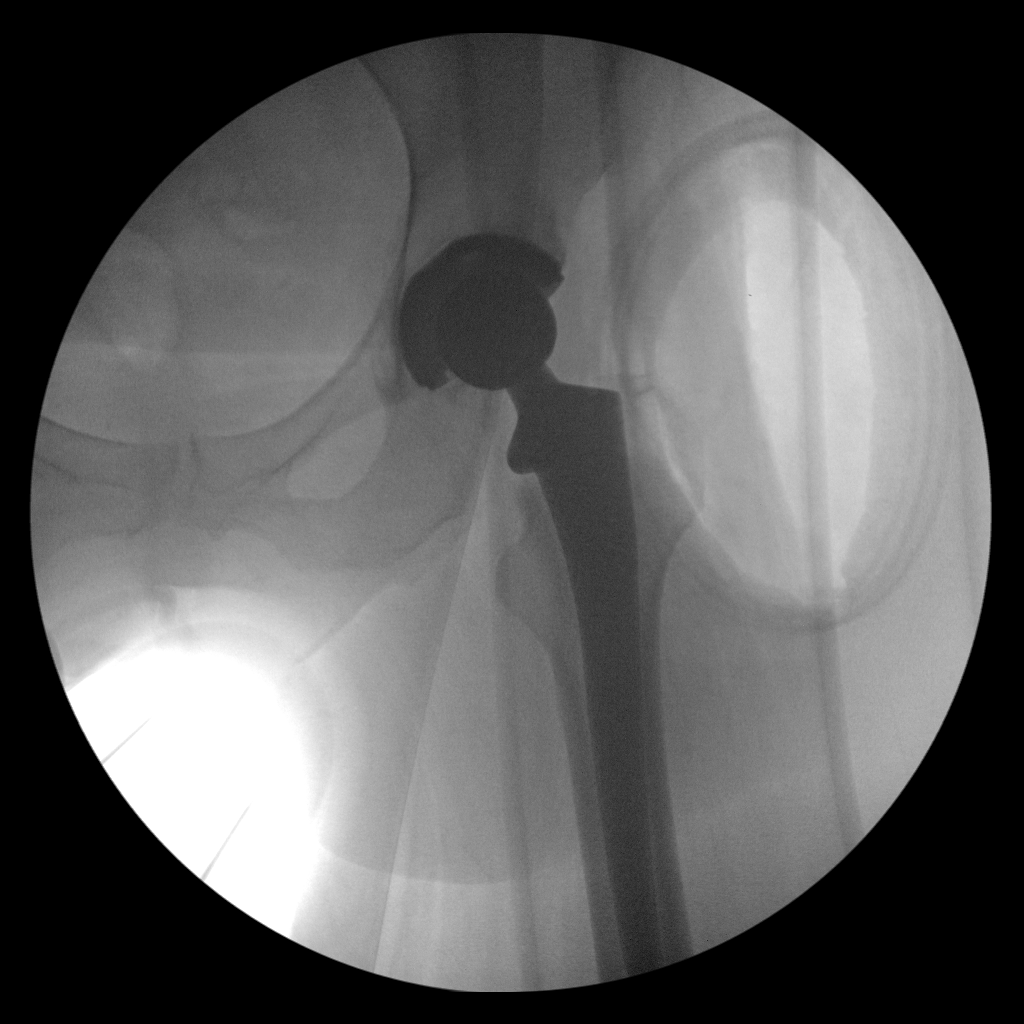
[im 3/3]
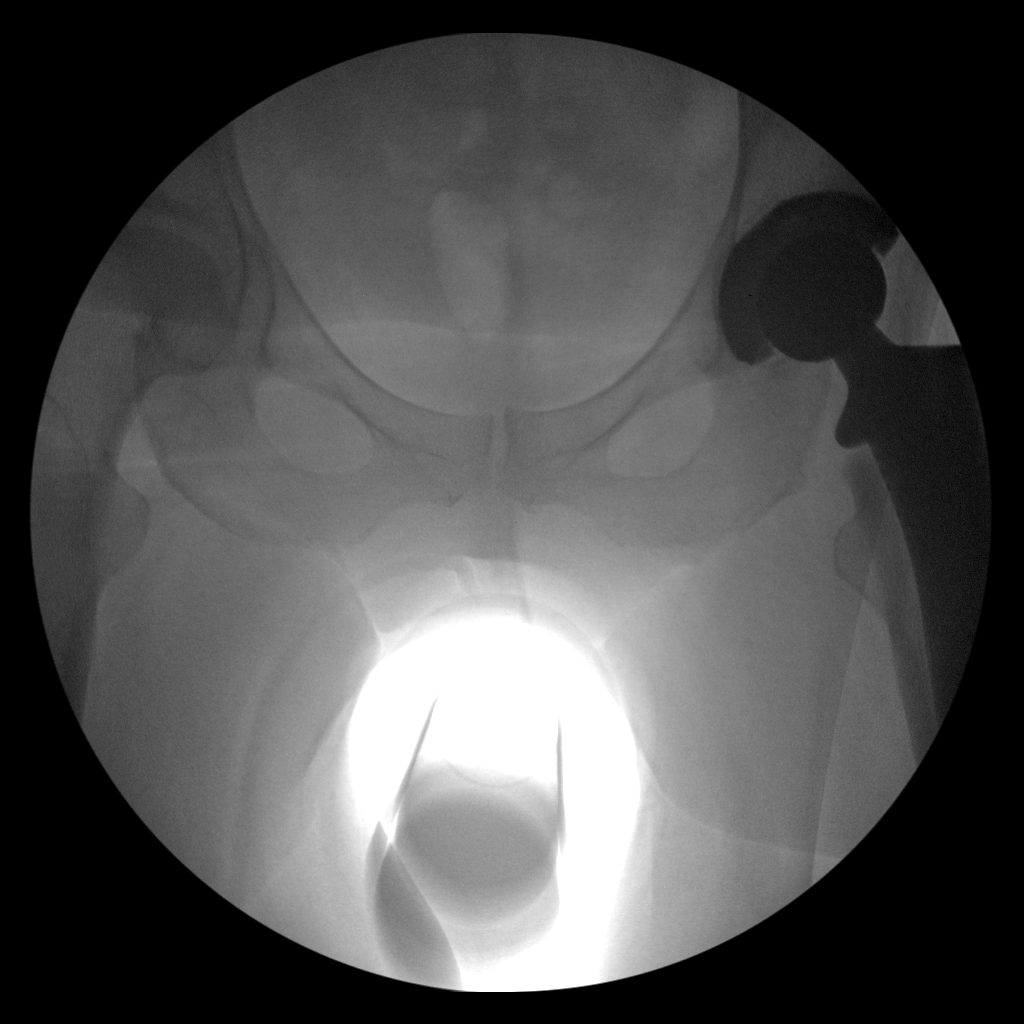

[3 of 3 positions shown; findings below may reference images not displayed]

FINDINGS: Components of a LEFT hip prosthesis in expected position.

No acute fracture or dislocation.

Bones demineralized.
IMPRESSION: LEFT prosthesis without acute complication.

## 2018-06-23 IMAGING — DX DG PORTABLE PELVIS
1 series · 1 of 1 positions shown · non-contrast
Comparison: Radiographs June 08, 2017.

CLINICAL DATA: Status post left hip replacement.

EXAM:
PORTABLE PELVIS 1-2 VIEWS

[pelvis ap]
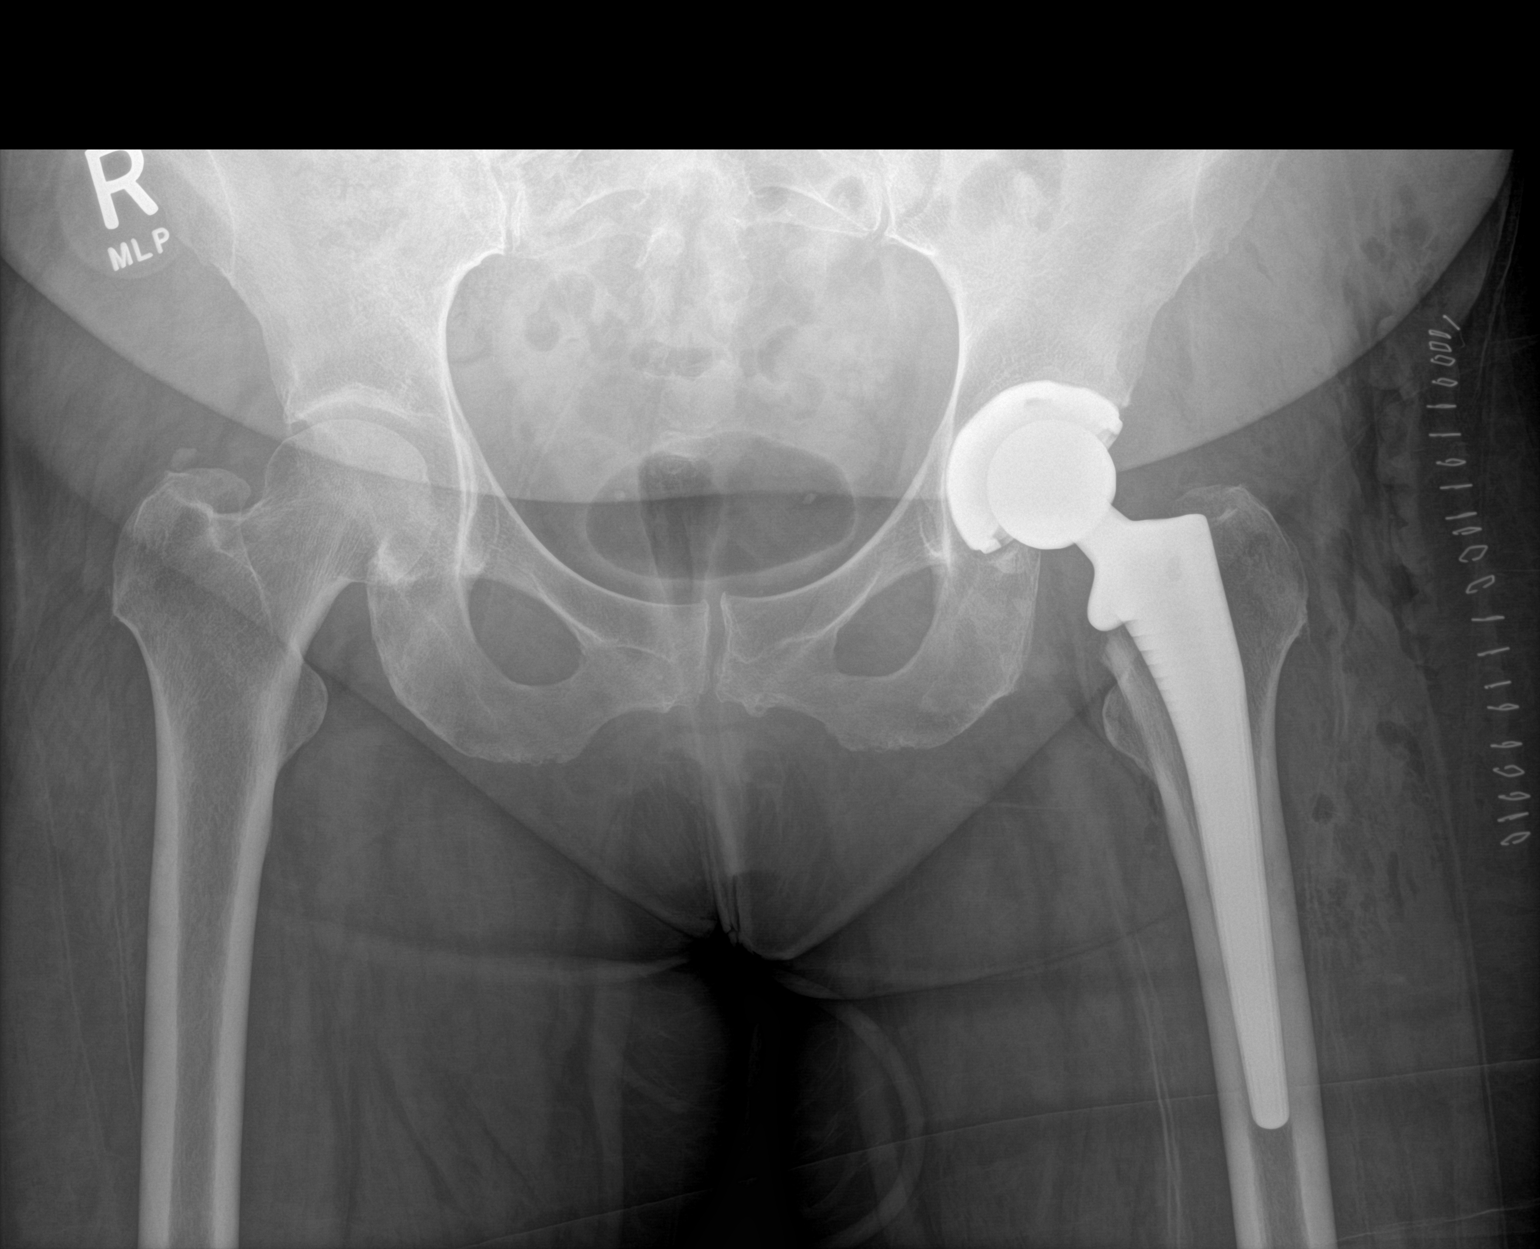

[1 of 1 positions shown; findings below may reference images not displayed]

FINDINGS: The femoral and acetabular components appear to be well situated. No
fracture or dislocation is noted. Expected postoperative changes are
seen in the adjacent soft tissues.
IMPRESSION: Status post left total hip arthroplasty.

## 2018-09-29 ENCOUNTER — Ambulatory Visit (INDEPENDENT_AMBULATORY_CARE_PROVIDER_SITE_OTHER): Payer: 59 | Admitting: Orthopaedic Surgery

## 2019-06-08 ENCOUNTER — Telehealth: Payer: Self-pay | Admitting: Orthopaedic Surgery

## 2019-06-08 NOTE — Telephone Encounter (Signed)
Patient called stated that she was told by [facility] she needed a new RX for her shoe.  Please fax new Rx to [phone]

## 2019-06-09 NOTE — Telephone Encounter (Signed)
She needs a prescription for [facility] for an insert for her right shoe due to leg length discrepancy with her right leg being shorter than the left after left hip replacement surgery.

## 2019-06-09 NOTE — Telephone Encounter (Signed)
i'm not really sure what the Rx needs to say, are you?

## 2019-06-13 NOTE — Telephone Encounter (Signed)
Faxed Rx and demographics to [facility]

## 2019-11-07 ENCOUNTER — Other Ambulatory Visit: Payer: Self-pay | Admitting: Family Medicine

## 2019-11-07 DIAGNOSIS — Z1231 Encounter for screening mammogram for malignant neoplasm of breast: Secondary | ICD-10-CM

## 2020-02-09 ENCOUNTER — Telehealth: Payer: Self-pay | Admitting: Orthopaedic Surgery

## 2020-02-09 NOTE — Telephone Encounter (Signed)
See script for [employer]

## 2020-02-09 NOTE — Telephone Encounter (Signed)
Patient called requesting a refill be sent to [facility] for new right shoe insert. Patient states provider informed her that a new shoe insert needed every year. Please send to pharmacy on file. Patient phone number is [phone].

## 2020-02-09 NOTE — Telephone Encounter (Signed)
Please advise 

## 2020-02-10 NOTE — Telephone Encounter (Signed)
Faxed

## 2020-02-10 NOTE — Telephone Encounter (Signed)
Can you fax that [facility] Rx for her on my desk please
